# Patient Record
Sex: Female | Born: 1965 | Race: Black or African American | Hispanic: No | Marital: Married | Smoking: Never smoker
Health system: Southern US, Community
[De-identification: ages and names within clinical notes are randomized; demographics above are authoritative.]

## PROBLEM LIST (undated history)

## (undated) DIAGNOSIS — M1990 Unspecified osteoarthritis, unspecified site: Secondary | ICD-10-CM

## (undated) DIAGNOSIS — I10 Essential (primary) hypertension: Secondary | ICD-10-CM

## (undated) DIAGNOSIS — K219 Gastro-esophageal reflux disease without esophagitis: Secondary | ICD-10-CM

## (undated) DIAGNOSIS — M797 Fibromyalgia: Secondary | ICD-10-CM

## (undated) DIAGNOSIS — F32A Depression, unspecified: Secondary | ICD-10-CM

## (undated) DIAGNOSIS — K589 Irritable bowel syndrome without diarrhea: Secondary | ICD-10-CM

## (undated) DIAGNOSIS — F329 Major depressive disorder, single episode, unspecified: Secondary | ICD-10-CM

## (undated) DIAGNOSIS — B029 Zoster without complications: Secondary | ICD-10-CM

## (undated) HISTORY — DX: Unspecified osteoarthritis, unspecified site: M19.90

## (undated) HISTORY — PX: CHOLECYSTECTOMY: SHX55

## (undated) HISTORY — PX: TUBAL LIGATION: SHX77

---

## 1994-10-10 HISTORY — PX: CYSTECTOMY: SUR359

## 1998-01-15 ENCOUNTER — Emergency Department (HOSPITAL_COMMUNITY): Admission: EM | Admit: 1998-01-15 | Discharge: 1998-01-15 | Payer: Self-pay | Admitting: Emergency Medicine

## 1998-05-26 ENCOUNTER — Emergency Department (HOSPITAL_COMMUNITY): Admission: EM | Admit: 1998-05-26 | Discharge: 1998-05-26 | Payer: Self-pay | Admitting: Internal Medicine

## 1998-09-25 ENCOUNTER — Ambulatory Visit (HOSPITAL_COMMUNITY): Admission: RE | Admit: 1998-09-25 | Discharge: 1998-09-25 | Payer: Self-pay | Admitting: Family Medicine

## 1998-09-25 ENCOUNTER — Encounter: Payer: Self-pay | Admitting: Family Medicine

## 1998-10-10 ENCOUNTER — Encounter: Payer: Self-pay | Admitting: Emergency Medicine

## 1998-10-10 ENCOUNTER — Emergency Department (HOSPITAL_COMMUNITY): Admission: EM | Admit: 1998-10-10 | Discharge: 1998-10-10 | Payer: Self-pay | Admitting: Emergency Medicine

## 1998-11-11 ENCOUNTER — Ambulatory Visit (HOSPITAL_COMMUNITY): Admission: RE | Admit: 1998-11-11 | Discharge: 1998-11-11 | Payer: Self-pay | Admitting: Chiropractic Medicine

## 1998-11-11 ENCOUNTER — Encounter: Payer: Self-pay | Admitting: Chiropractic Medicine

## 1999-02-01 ENCOUNTER — Encounter: Admission: RE | Admit: 1999-02-01 | Discharge: 1999-02-17 | Payer: Self-pay | Admitting: Neurosurgery

## 1999-03-01 ENCOUNTER — Ambulatory Visit (HOSPITAL_COMMUNITY): Admission: RE | Admit: 1999-03-01 | Discharge: 1999-03-01 | Payer: Self-pay | Admitting: Orthopedic Surgery

## 1999-03-01 ENCOUNTER — Encounter: Payer: Self-pay | Admitting: Orthopedic Surgery

## 1999-03-19 ENCOUNTER — Encounter: Admission: RE | Admit: 1999-03-19 | Discharge: 1999-05-05 | Payer: Self-pay | Admitting: Orthopedic Surgery

## 1999-06-30 ENCOUNTER — Emergency Department (HOSPITAL_COMMUNITY): Admission: EM | Admit: 1999-06-30 | Discharge: 1999-06-30 | Payer: Self-pay | Admitting: Emergency Medicine

## 2000-03-03 ENCOUNTER — Emergency Department (HOSPITAL_COMMUNITY): Admission: EM | Admit: 2000-03-03 | Discharge: 2000-03-03 | Payer: Self-pay | Admitting: Emergency Medicine

## 2000-06-03 ENCOUNTER — Emergency Department (HOSPITAL_COMMUNITY): Admission: EM | Admit: 2000-06-03 | Discharge: 2000-06-03 | Payer: Self-pay | Admitting: Emergency Medicine

## 2000-06-05 ENCOUNTER — Inpatient Hospital Stay (HOSPITAL_COMMUNITY): Admission: EM | Admit: 2000-06-05 | Discharge: 2000-06-07 | Payer: Self-pay | Admitting: Emergency Medicine

## 2000-06-05 ENCOUNTER — Encounter: Payer: Self-pay | Admitting: Emergency Medicine

## 2000-06-05 ENCOUNTER — Encounter (INDEPENDENT_AMBULATORY_CARE_PROVIDER_SITE_OTHER): Payer: Self-pay

## 2000-06-05 ENCOUNTER — Ambulatory Visit (HOSPITAL_COMMUNITY): Admission: RE | Admit: 2000-06-05 | Discharge: 2000-06-05 | Payer: Self-pay | Admitting: Emergency Medicine

## 2001-01-11 ENCOUNTER — Encounter: Admission: RE | Admit: 2001-01-11 | Discharge: 2001-01-11 | Payer: Self-pay | Admitting: Hematology and Oncology

## 2001-01-18 ENCOUNTER — Encounter: Admission: RE | Admit: 2001-01-18 | Discharge: 2001-01-18 | Payer: Self-pay | Admitting: Hematology and Oncology

## 2001-01-29 ENCOUNTER — Emergency Department (HOSPITAL_COMMUNITY): Admission: EM | Admit: 2001-01-29 | Discharge: 2001-01-29 | Payer: Self-pay | Admitting: Emergency Medicine

## 2001-02-18 ENCOUNTER — Emergency Department (HOSPITAL_COMMUNITY): Admission: EM | Admit: 2001-02-18 | Discharge: 2001-02-18 | Payer: Self-pay | Admitting: Emergency Medicine

## 2001-03-01 ENCOUNTER — Encounter: Payer: Self-pay | Admitting: Emergency Medicine

## 2001-03-01 ENCOUNTER — Emergency Department (HOSPITAL_COMMUNITY): Admission: EM | Admit: 2001-03-01 | Discharge: 2001-03-01 | Payer: Self-pay | Admitting: Internal Medicine

## 2001-03-29 ENCOUNTER — Encounter: Admission: RE | Admit: 2001-03-29 | Discharge: 2001-03-29 | Payer: Self-pay | Admitting: Internal Medicine

## 2001-08-20 ENCOUNTER — Encounter: Admission: RE | Admit: 2001-08-20 | Discharge: 2001-08-20 | Payer: Self-pay | Admitting: Internal Medicine

## 2001-12-26 ENCOUNTER — Encounter: Admission: RE | Admit: 2001-12-26 | Discharge: 2001-12-26 | Payer: Self-pay | Admitting: Internal Medicine

## 2002-01-11 ENCOUNTER — Encounter: Admission: RE | Admit: 2002-01-11 | Discharge: 2002-01-11 | Payer: Self-pay | Admitting: Internal Medicine

## 2002-01-28 ENCOUNTER — Encounter: Payer: Self-pay | Admitting: Emergency Medicine

## 2002-01-28 ENCOUNTER — Emergency Department (HOSPITAL_COMMUNITY): Admission: EM | Admit: 2002-01-28 | Discharge: 2002-01-28 | Payer: Self-pay | Admitting: Emergency Medicine

## 2002-02-04 ENCOUNTER — Encounter: Admission: RE | Admit: 2002-02-04 | Discharge: 2002-02-04 | Payer: Self-pay | Admitting: *Deleted

## 2002-07-12 ENCOUNTER — Encounter: Payer: Self-pay | Admitting: Emergency Medicine

## 2002-07-12 ENCOUNTER — Emergency Department (HOSPITAL_COMMUNITY): Admission: EM | Admit: 2002-07-12 | Discharge: 2002-07-12 | Payer: Self-pay | Admitting: Emergency Medicine

## 2002-08-28 ENCOUNTER — Encounter: Admission: RE | Admit: 2002-08-28 | Discharge: 2002-08-28 | Payer: Self-pay | Admitting: Internal Medicine

## 2002-08-28 ENCOUNTER — Ambulatory Visit (HOSPITAL_COMMUNITY): Admission: RE | Admit: 2002-08-28 | Discharge: 2002-08-28 | Payer: Self-pay | Admitting: Internal Medicine

## 2002-08-28 ENCOUNTER — Encounter: Payer: Self-pay | Admitting: Internal Medicine

## 2002-09-25 ENCOUNTER — Encounter: Admission: RE | Admit: 2002-09-25 | Discharge: 2002-09-25 | Payer: Self-pay | Admitting: Internal Medicine

## 2002-09-30 ENCOUNTER — Emergency Department (HOSPITAL_COMMUNITY): Admission: EM | Admit: 2002-09-30 | Discharge: 2002-10-01 | Payer: Self-pay | Admitting: Emergency Medicine

## 2002-10-01 ENCOUNTER — Encounter: Payer: Self-pay | Admitting: Emergency Medicine

## 2002-10-17 ENCOUNTER — Encounter: Admission: RE | Admit: 2002-10-17 | Discharge: 2002-10-17 | Payer: Self-pay | Admitting: Internal Medicine

## 2003-08-27 ENCOUNTER — Encounter: Admission: RE | Admit: 2003-08-27 | Discharge: 2003-08-27 | Payer: Self-pay | Admitting: Internal Medicine

## 2003-09-11 ENCOUNTER — Emergency Department (HOSPITAL_COMMUNITY): Admission: EM | Admit: 2003-09-11 | Discharge: 2003-09-11 | Payer: Self-pay | Admitting: Emergency Medicine

## 2003-09-23 ENCOUNTER — Encounter: Admission: RE | Admit: 2003-09-23 | Discharge: 2003-09-23 | Payer: Self-pay | Admitting: Internal Medicine

## 2003-11-12 ENCOUNTER — Encounter: Admission: RE | Admit: 2003-11-12 | Discharge: 2003-11-12 | Payer: Self-pay | Admitting: Internal Medicine

## 2003-11-14 ENCOUNTER — Ambulatory Visit (HOSPITAL_COMMUNITY): Admission: RE | Admit: 2003-11-14 | Discharge: 2003-11-14 | Payer: Self-pay | Admitting: Internal Medicine

## 2004-01-14 ENCOUNTER — Emergency Department (HOSPITAL_COMMUNITY): Admission: EM | Admit: 2004-01-14 | Discharge: 2004-01-14 | Payer: Self-pay | Admitting: Emergency Medicine

## 2004-01-19 ENCOUNTER — Encounter: Admission: RE | Admit: 2004-01-19 | Discharge: 2004-01-19 | Payer: Self-pay | Admitting: Internal Medicine

## 2004-01-27 ENCOUNTER — Encounter: Admission: RE | Admit: 2004-01-27 | Discharge: 2004-01-27 | Payer: Self-pay | Admitting: Internal Medicine

## 2004-02-13 ENCOUNTER — Emergency Department (HOSPITAL_COMMUNITY): Admission: EM | Admit: 2004-02-13 | Discharge: 2004-02-13 | Payer: Self-pay | Admitting: Emergency Medicine

## 2004-02-17 ENCOUNTER — Encounter: Admission: RE | Admit: 2004-02-17 | Discharge: 2004-02-17 | Payer: Self-pay | Admitting: Internal Medicine

## 2004-04-09 ENCOUNTER — Emergency Department (HOSPITAL_COMMUNITY): Admission: EM | Admit: 2004-04-09 | Discharge: 2004-04-09 | Payer: Self-pay | Admitting: Emergency Medicine

## 2004-06-03 ENCOUNTER — Encounter: Admission: RE | Admit: 2004-06-03 | Discharge: 2004-06-03 | Payer: Self-pay | Admitting: Internal Medicine

## 2004-06-18 ENCOUNTER — Emergency Department (HOSPITAL_COMMUNITY): Admission: EM | Admit: 2004-06-18 | Discharge: 2004-06-18 | Payer: Self-pay | Admitting: Emergency Medicine

## 2004-06-25 ENCOUNTER — Ambulatory Visit: Payer: Self-pay | Admitting: Internal Medicine

## 2004-07-15 ENCOUNTER — Ambulatory Visit: Payer: Self-pay | Admitting: Internal Medicine

## 2004-08-28 ENCOUNTER — Emergency Department (HOSPITAL_COMMUNITY): Admission: EM | Admit: 2004-08-28 | Discharge: 2004-08-29 | Payer: Self-pay | Admitting: Emergency Medicine

## 2004-09-06 ENCOUNTER — Ambulatory Visit: Payer: Self-pay | Admitting: Internal Medicine

## 2004-09-15 ENCOUNTER — Ambulatory Visit (HOSPITAL_COMMUNITY): Admission: RE | Admit: 2004-09-15 | Discharge: 2004-09-15 | Payer: Self-pay | Admitting: *Deleted

## 2004-10-14 ENCOUNTER — Encounter (INDEPENDENT_AMBULATORY_CARE_PROVIDER_SITE_OTHER): Payer: Self-pay | Admitting: *Deleted

## 2004-10-14 ENCOUNTER — Ambulatory Visit: Payer: Self-pay | Admitting: Internal Medicine

## 2004-12-26 ENCOUNTER — Encounter: Admission: RE | Admit: 2004-12-26 | Discharge: 2004-12-26 | Payer: Self-pay | Admitting: Rheumatology

## 2005-01-10 ENCOUNTER — Ambulatory Visit: Payer: Self-pay | Admitting: Internal Medicine

## 2005-02-06 ENCOUNTER — Emergency Department (HOSPITAL_COMMUNITY): Admission: EM | Admit: 2005-02-06 | Discharge: 2005-02-06 | Payer: Self-pay | Admitting: *Deleted

## 2005-03-09 ENCOUNTER — Ambulatory Visit: Payer: Self-pay | Admitting: Internal Medicine

## 2005-04-27 ENCOUNTER — Encounter: Admission: RE | Admit: 2005-04-27 | Discharge: 2005-04-27 | Payer: Self-pay | Admitting: Rheumatology

## 2005-08-02 ENCOUNTER — Emergency Department (HOSPITAL_COMMUNITY): Admission: EM | Admit: 2005-08-02 | Discharge: 2005-08-02 | Payer: Self-pay | Admitting: Emergency Medicine

## 2005-08-12 ENCOUNTER — Ambulatory Visit: Payer: Self-pay | Admitting: Internal Medicine

## 2005-11-09 ENCOUNTER — Ambulatory Visit (HOSPITAL_COMMUNITY): Admission: RE | Admit: 2005-11-09 | Discharge: 2005-11-09 | Payer: Self-pay | Admitting: Gastroenterology

## 2006-02-14 ENCOUNTER — Ambulatory Visit: Payer: Self-pay | Admitting: Internal Medicine

## 2006-03-06 ENCOUNTER — Emergency Department: Payer: Self-pay | Admitting: Emergency Medicine

## 2006-05-04 ENCOUNTER — Ambulatory Visit: Payer: Self-pay | Admitting: Internal Medicine

## 2006-09-04 IMAGING — US US TRANSVAGINAL NON-OB
1 series · 14 of 25 positions shown · non-contrast
Comparison: none

CLINICAL DATA: Right ovarian cyst on recent CT scan from 08/28/04.  
 TRANSABDOMINAL AND ENDOVAGINAL PELVIC ULTRASOUND:
 Transabdominal and endovaginal scanning was performed.  
 The uterus measures 9.7 x 5.5 x 6.3 cm.  Endometrial stripe is 7 ? 8 mm in thickness.  Prominent vascular anatomy noted in the peripheral myometrium, although myometrial echotexture is homogeneous.  
 The right ovary is 3.8 x 2.0 x 2.1 cm.  The left ovary measures 2.9 x 2.2 x 2.7 cm.  Small amount of fluid is identified in the peritoneal cavity.

[Series 1: us transvaginal non-ob · 0.33mm/px · 14 of 74 slices shown]
[im 1/74]
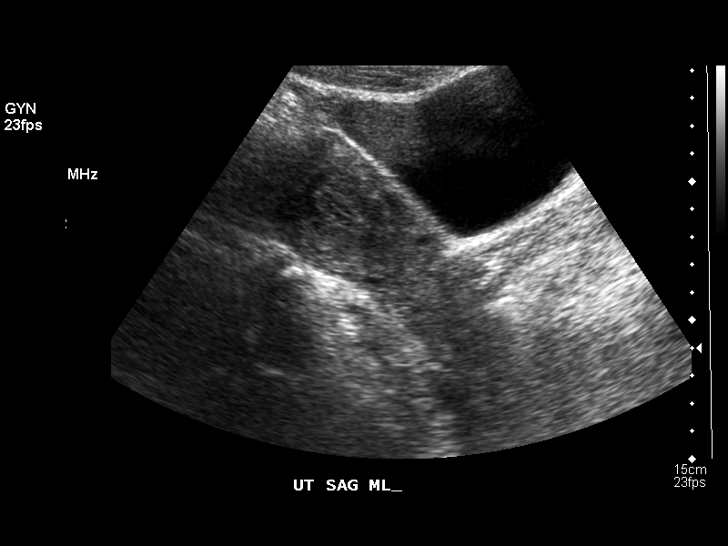
[im 7/74]
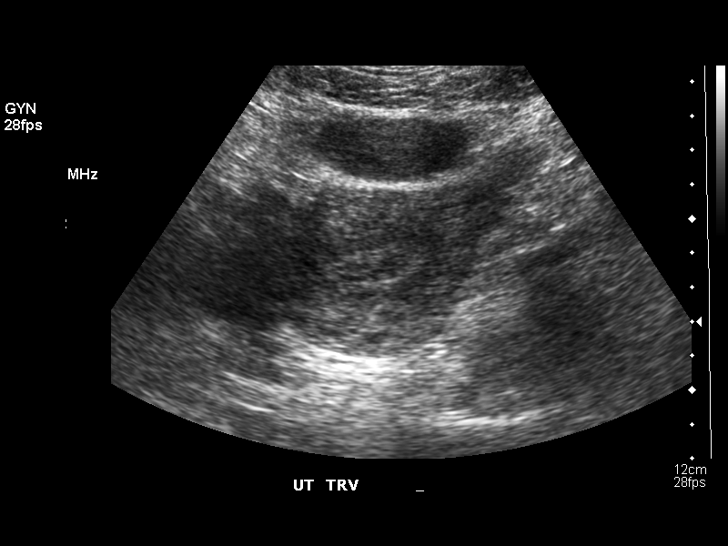
[im 13/74]
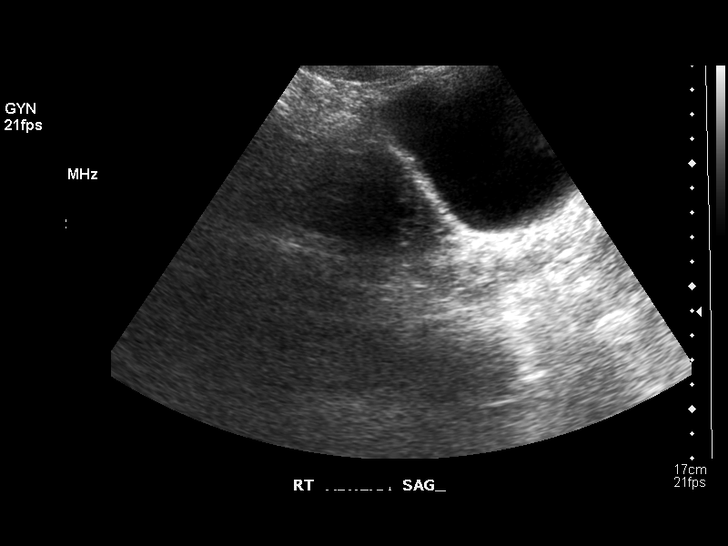
[im 19/74]
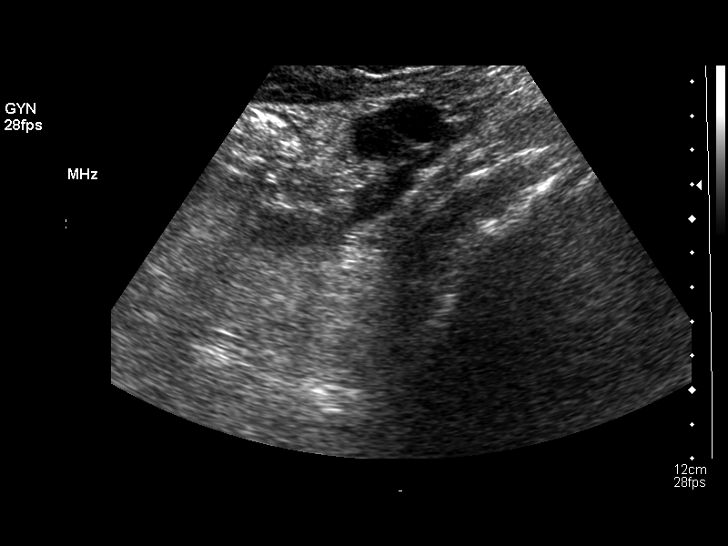
[im 25/74]
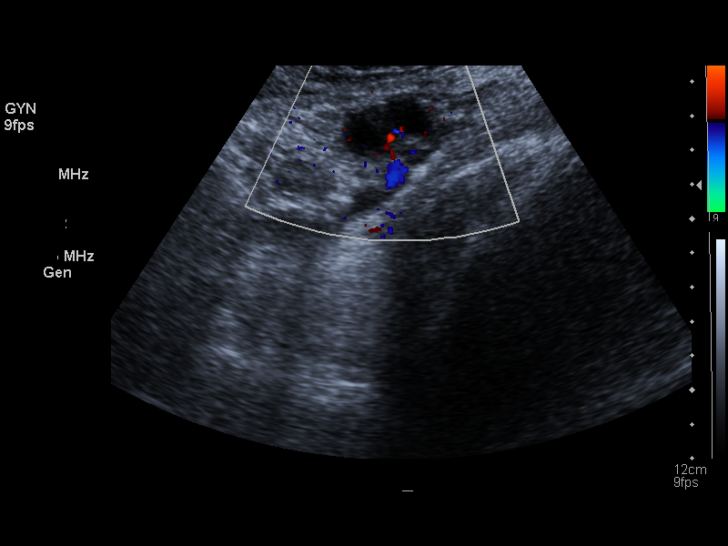
[im 28/74]
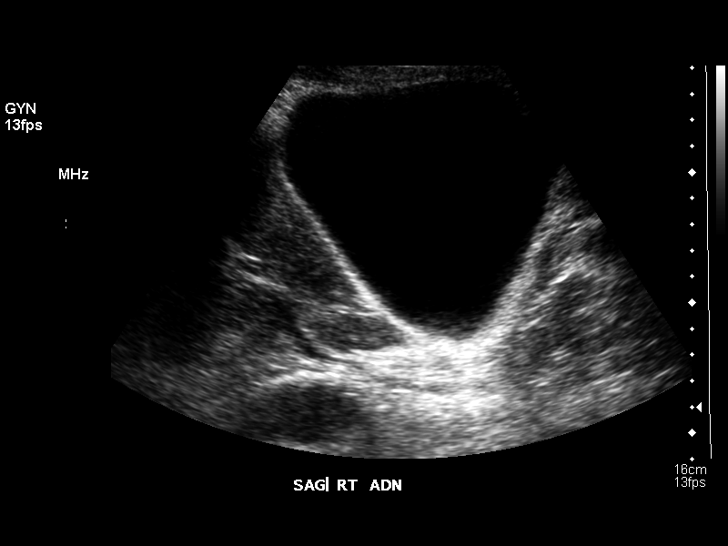
[im 34/74]
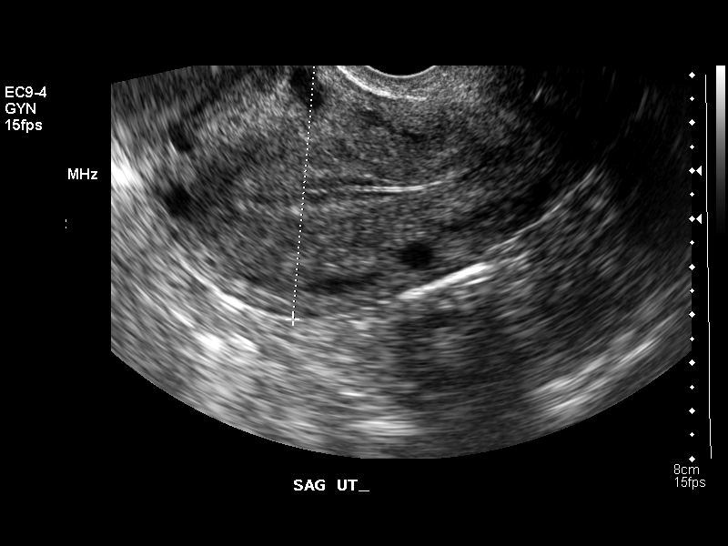
[im 40/74]
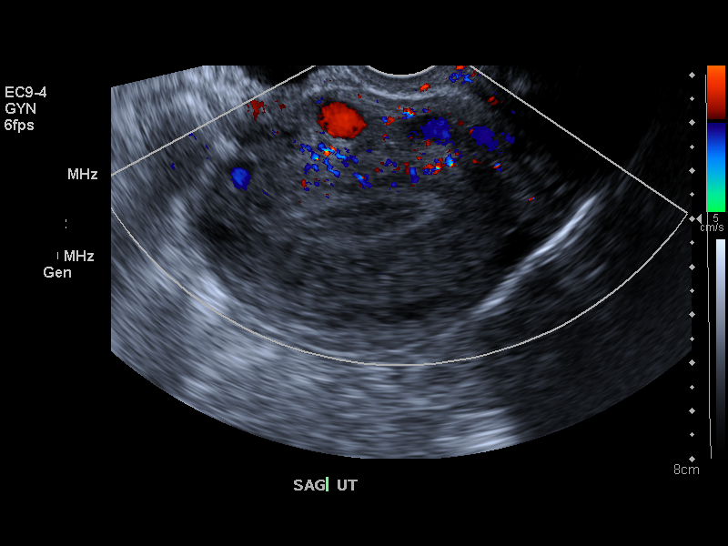
[im 46/74]
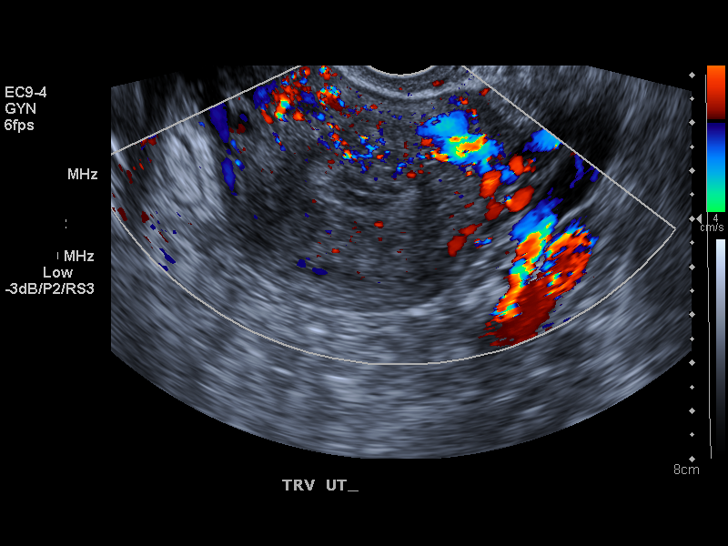
[im 49/74]
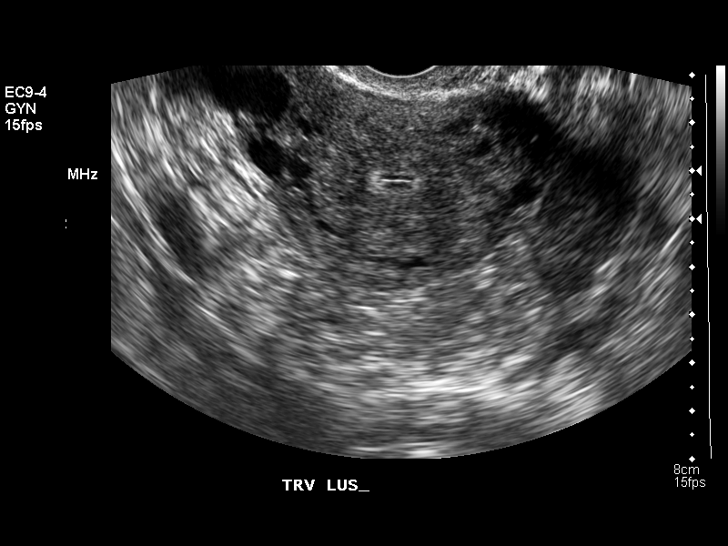
[im 55/74]
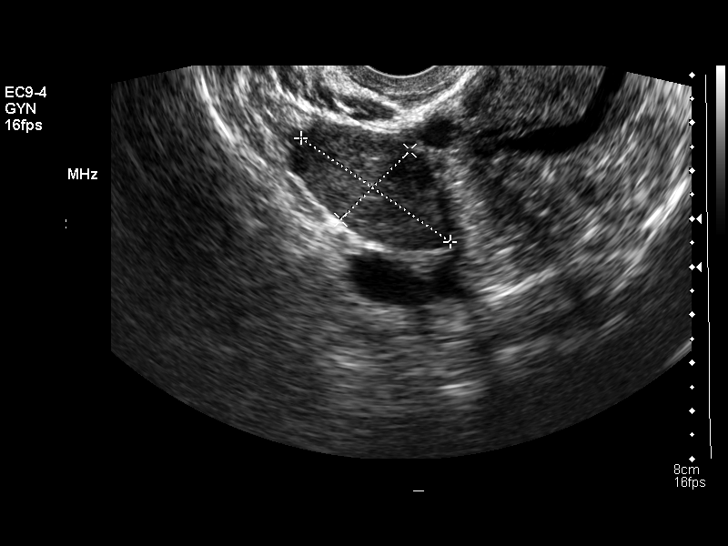
[im 61/74]
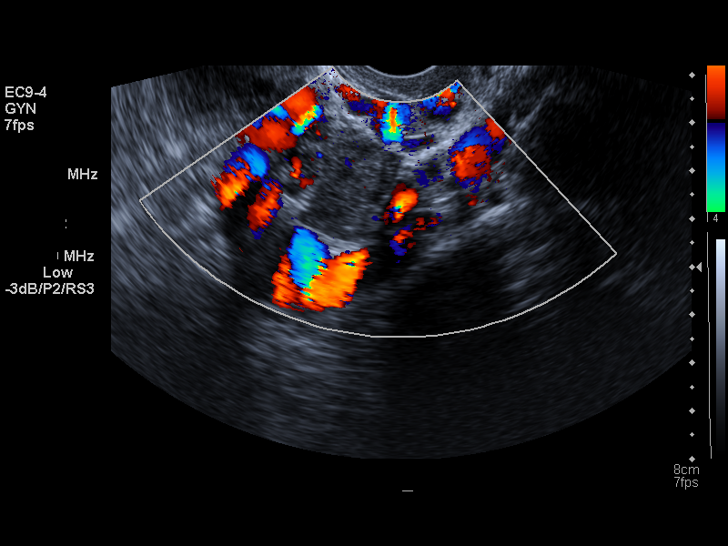
[im 67/74]
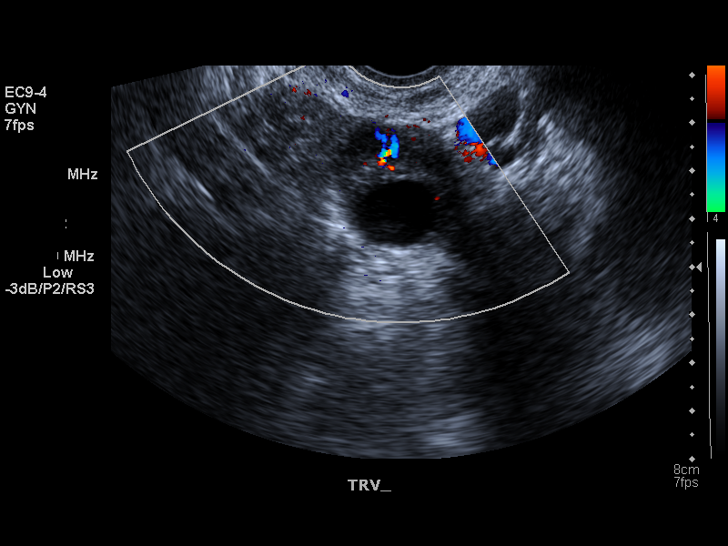
[im 74/74]
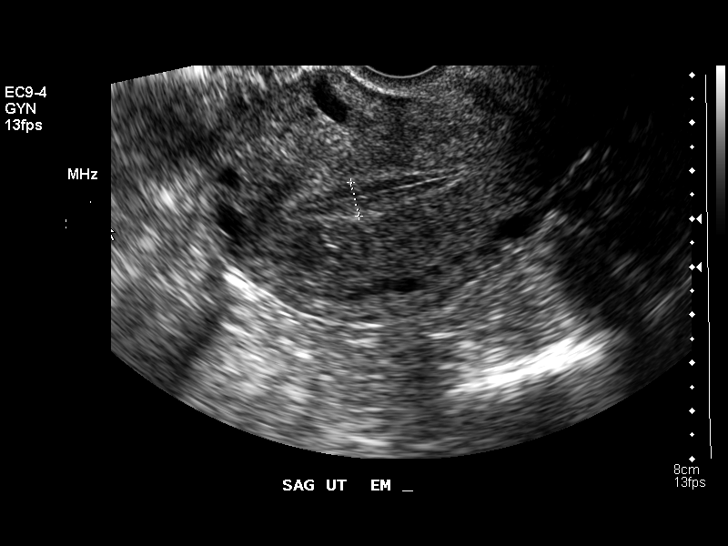

[14 of 25 positions shown; findings below may reference images not displayed]

IMPRESSION: 1.  No evidence for ovarian cyst.  The ovaries have normal sonographic appearance bilaterally.
 2.  Trace free fluid in the peritoneal cavity.

## 2006-09-18 ENCOUNTER — Ambulatory Visit: Payer: Self-pay | Admitting: Internal Medicine

## 2006-09-25 ENCOUNTER — Ambulatory Visit: Payer: Self-pay | Admitting: Gastroenterology

## 2006-10-20 ENCOUNTER — Ambulatory Visit: Payer: Self-pay | Admitting: Gastroenterology

## 2006-10-20 LAB — CONVERTED CEMR LAB
BUN: 9 mg/dL (ref 6–23)
CO2: 32 meq/L (ref 19–32)
Calcium: 9.6 mg/dL (ref 8.4–10.5)
Creatinine, Ser: 0.8 mg/dL (ref 0.4–1.2)
Glomerular Filtration Rate, Af Am: 101 mL/min/{1.73_m2}
Glucose, Bld: 117 mg/dL — ABNORMAL HIGH (ref 70–99)
Potassium: 3.8 meq/L (ref 3.5–5.1)

## 2006-10-27 ENCOUNTER — Emergency Department (HOSPITAL_COMMUNITY): Admission: EM | Admit: 2006-10-27 | Discharge: 2006-10-27 | Payer: Self-pay | Admitting: Emergency Medicine

## 2006-10-29 ENCOUNTER — Emergency Department (HOSPITAL_COMMUNITY): Admission: EM | Admit: 2006-10-29 | Discharge: 2006-10-29 | Payer: Self-pay | Admitting: Family Medicine

## 2006-11-10 ENCOUNTER — Emergency Department (HOSPITAL_COMMUNITY): Admission: EM | Admit: 2006-11-10 | Discharge: 2006-11-10 | Payer: Self-pay | Admitting: Emergency Medicine

## 2006-11-20 ENCOUNTER — Emergency Department (HOSPITAL_COMMUNITY): Admission: EM | Admit: 2006-11-20 | Discharge: 2006-11-20 | Payer: Self-pay | Admitting: Emergency Medicine

## 2006-12-01 ENCOUNTER — Ambulatory Visit: Payer: Self-pay | Admitting: Internal Medicine

## 2006-12-01 DIAGNOSIS — G43909 Migraine, unspecified, not intractable, without status migrainosus: Secondary | ICD-10-CM | POA: Insufficient documentation

## 2006-12-01 DIAGNOSIS — K219 Gastro-esophageal reflux disease without esophagitis: Secondary | ICD-10-CM | POA: Insufficient documentation

## 2006-12-01 DIAGNOSIS — K2990 Gastroduodenitis, unspecified, without bleeding: Secondary | ICD-10-CM

## 2006-12-01 DIAGNOSIS — F329 Major depressive disorder, single episode, unspecified: Secondary | ICD-10-CM

## 2006-12-01 DIAGNOSIS — B957 Other staphylococcus as the cause of diseases classified elsewhere: Secondary | ICD-10-CM

## 2006-12-01 DIAGNOSIS — K589 Irritable bowel syndrome without diarrhea: Secondary | ICD-10-CM

## 2006-12-01 DIAGNOSIS — K2970 Gastritis, unspecified, without bleeding: Secondary | ICD-10-CM | POA: Insufficient documentation

## 2006-12-01 DIAGNOSIS — F411 Generalized anxiety disorder: Secondary | ICD-10-CM | POA: Insufficient documentation

## 2006-12-01 DIAGNOSIS — J301 Allergic rhinitis due to pollen: Secondary | ICD-10-CM

## 2007-01-02 ENCOUNTER — Telehealth: Payer: Self-pay | Admitting: *Deleted

## 2007-02-07 ENCOUNTER — Telehealth (INDEPENDENT_AMBULATORY_CARE_PROVIDER_SITE_OTHER): Payer: Self-pay | Admitting: *Deleted

## 2007-03-19 ENCOUNTER — Ambulatory Visit: Payer: Self-pay | Admitting: Internal Medicine

## 2007-03-19 DIAGNOSIS — IMO0001 Reserved for inherently not codable concepts without codable children: Secondary | ICD-10-CM

## 2007-04-19 ENCOUNTER — Ambulatory Visit: Payer: Self-pay | Admitting: Internal Medicine

## 2007-04-19 ENCOUNTER — Telehealth: Payer: Self-pay | Admitting: *Deleted

## 2007-04-20 ENCOUNTER — Telehealth: Payer: Self-pay | Admitting: *Deleted

## 2007-06-01 ENCOUNTER — Telehealth: Payer: Self-pay | Admitting: *Deleted

## 2007-06-18 ENCOUNTER — Ambulatory Visit: Payer: Self-pay | Admitting: Internal Medicine

## 2007-06-18 ENCOUNTER — Encounter (INDEPENDENT_AMBULATORY_CARE_PROVIDER_SITE_OTHER): Payer: Self-pay | Admitting: *Deleted

## 2007-06-18 DIAGNOSIS — N912 Amenorrhea, unspecified: Secondary | ICD-10-CM | POA: Insufficient documentation

## 2007-06-19 ENCOUNTER — Ambulatory Visit (HOSPITAL_COMMUNITY): Admission: RE | Admit: 2007-06-19 | Discharge: 2007-06-19 | Payer: Self-pay | Admitting: *Deleted

## 2007-07-05 ENCOUNTER — Encounter (INDEPENDENT_AMBULATORY_CARE_PROVIDER_SITE_OTHER): Payer: Self-pay | Admitting: *Deleted

## 2007-07-05 ENCOUNTER — Ambulatory Visit: Payer: Self-pay | Admitting: Internal Medicine

## 2007-07-05 DIAGNOSIS — M25519 Pain in unspecified shoulder: Secondary | ICD-10-CM | POA: Insufficient documentation

## 2007-07-05 DIAGNOSIS — N951 Menopausal and female climacteric states: Secondary | ICD-10-CM

## 2007-07-12 ENCOUNTER — Ambulatory Visit: Payer: Self-pay | Admitting: Gastroenterology

## 2007-07-25 ENCOUNTER — Ambulatory Visit: Payer: Self-pay | Admitting: Gastroenterology

## 2007-07-30 ENCOUNTER — Ambulatory Visit (HOSPITAL_COMMUNITY): Admission: RE | Admit: 2007-07-30 | Discharge: 2007-07-30 | Payer: Self-pay | Admitting: Gastroenterology

## 2007-08-21 ENCOUNTER — Emergency Department (HOSPITAL_COMMUNITY): Admission: EM | Admit: 2007-08-21 | Discharge: 2007-08-21 | Payer: Self-pay | Admitting: Emergency Medicine

## 2007-08-21 ENCOUNTER — Telehealth: Payer: Self-pay | Admitting: *Deleted

## 2007-09-23 ENCOUNTER — Emergency Department (HOSPITAL_COMMUNITY): Admission: EM | Admit: 2007-09-23 | Discharge: 2007-09-23 | Payer: Self-pay | Admitting: Emergency Medicine

## 2007-10-10 ENCOUNTER — Emergency Department (HOSPITAL_COMMUNITY): Admission: EM | Admit: 2007-10-10 | Discharge: 2007-10-10 | Payer: Self-pay | Admitting: Family Medicine

## 2007-10-19 ENCOUNTER — Encounter (INDEPENDENT_AMBULATORY_CARE_PROVIDER_SITE_OTHER): Payer: Self-pay | Admitting: Internal Medicine

## 2007-10-29 ENCOUNTER — Telehealth: Payer: Self-pay | Admitting: *Deleted

## 2007-10-29 ENCOUNTER — Ambulatory Visit: Payer: Self-pay | Admitting: Internal Medicine

## 2007-11-16 ENCOUNTER — Encounter (INDEPENDENT_AMBULATORY_CARE_PROVIDER_SITE_OTHER): Payer: Self-pay | Admitting: *Deleted

## 2007-11-16 ENCOUNTER — Ambulatory Visit: Payer: Self-pay | Admitting: Hospitalist

## 2007-11-16 DIAGNOSIS — I10 Essential (primary) hypertension: Secondary | ICD-10-CM | POA: Insufficient documentation

## 2007-11-16 LAB — CONVERTED CEMR LAB
Alkaline Phosphatase: 68 units/L (ref 39–117)
BUN: 11 mg/dL (ref 6–23)
CO2: 28 meq/L (ref 19–32)
Creatinine, Ser: 0.87 mg/dL (ref 0.40–1.20)
Glucose, Bld: 91 mg/dL (ref 70–99)
Total Bilirubin: 0.3 mg/dL (ref 0.3–1.2)
Total Protein: 7.9 g/dL (ref 6.0–8.3)

## 2007-12-21 ENCOUNTER — Emergency Department (HOSPITAL_COMMUNITY): Admission: EM | Admit: 2007-12-21 | Discharge: 2007-12-21 | Payer: Self-pay | Admitting: Family Medicine

## 2008-01-07 ENCOUNTER — Telehealth: Payer: Self-pay | Admitting: *Deleted

## 2008-02-22 ENCOUNTER — Telehealth: Payer: Self-pay | Admitting: Infectious Disease

## 2008-02-25 ENCOUNTER — Telehealth (INDEPENDENT_AMBULATORY_CARE_PROVIDER_SITE_OTHER): Payer: Self-pay | Admitting: *Deleted

## 2008-03-10 ENCOUNTER — Ambulatory Visit: Payer: Self-pay | Admitting: Internal Medicine

## 2008-03-10 ENCOUNTER — Encounter (INDEPENDENT_AMBULATORY_CARE_PROVIDER_SITE_OTHER): Payer: Self-pay | Admitting: *Deleted

## 2008-03-10 DIAGNOSIS — J330 Polyp of nasal cavity: Secondary | ICD-10-CM | POA: Insufficient documentation

## 2008-03-10 DIAGNOSIS — H5200 Hypermetropia, unspecified eye: Secondary | ICD-10-CM

## 2008-03-10 LAB — CONVERTED CEMR LAB
CO2: 27 meq/L (ref 19–32)
Calcium: 9.4 mg/dL (ref 8.4–10.5)
Glucose, Bld: 90 mg/dL (ref 70–99)
Potassium: 4.3 meq/L (ref 3.5–5.3)
Sodium: 140 meq/L (ref 135–145)

## 2008-03-17 ENCOUNTER — Encounter (INDEPENDENT_AMBULATORY_CARE_PROVIDER_SITE_OTHER): Payer: Self-pay | Admitting: *Deleted

## 2008-04-11 ENCOUNTER — Emergency Department (HOSPITAL_COMMUNITY): Admission: EM | Admit: 2008-04-11 | Discharge: 2008-04-11 | Payer: Self-pay | Admitting: Emergency Medicine

## 2008-05-05 ENCOUNTER — Emergency Department (HOSPITAL_COMMUNITY): Admission: EM | Admit: 2008-05-05 | Discharge: 2008-05-05 | Payer: Self-pay | Admitting: Emergency Medicine

## 2008-05-06 ENCOUNTER — Inpatient Hospital Stay (HOSPITAL_COMMUNITY): Admission: AD | Admit: 2008-05-06 | Discharge: 2008-05-09 | Payer: Self-pay | Admitting: Internal Medicine

## 2008-05-06 ENCOUNTER — Ambulatory Visit: Payer: Self-pay | Admitting: Internal Medicine

## 2008-05-06 DIAGNOSIS — L0292 Furuncle, unspecified: Secondary | ICD-10-CM | POA: Insufficient documentation

## 2008-05-06 DIAGNOSIS — L0293 Carbuncle, unspecified: Secondary | ICD-10-CM

## 2008-05-08 DIAGNOSIS — R945 Abnormal results of liver function studies: Secondary | ICD-10-CM

## 2008-05-12 ENCOUNTER — Ambulatory Visit: Payer: Self-pay | Admitting: Infectious Diseases

## 2008-05-12 ENCOUNTER — Encounter (INDEPENDENT_AMBULATORY_CARE_PROVIDER_SITE_OTHER): Payer: Self-pay | Admitting: *Deleted

## 2008-05-13 LAB — CONVERTED CEMR LAB
ALT: 74 units/L — ABNORMAL HIGH (ref 0–35)
Alkaline Phosphatase: 132 units/L — ABNORMAL HIGH (ref 39–117)
BUN: 8 mg/dL (ref 6–23)
Calcium: 9.4 mg/dL (ref 8.4–10.5)
Chloride: 99 meq/L (ref 96–112)
Creatinine, Ser: 0.93 mg/dL (ref 0.40–1.20)
Eosinophils Absolute: 0.5 10*3/uL (ref 0.0–0.7)
Eosinophils Relative: 10 % — ABNORMAL HIGH (ref 0–5)
Glucose, Bld: 63 mg/dL — ABNORMAL LOW (ref 70–99)
HCT: 38 % (ref 36.0–46.0)
Hemoglobin: 12.6 g/dL (ref 12.0–15.0)
Lymphocytes Relative: 13 % (ref 12–46)
Lymphs Abs: 0.6 10*3/uL — ABNORMAL LOW (ref 0.7–4.0)
MCV: 94.6 fL (ref 78.0–100.0)
Monocytes Absolute: 0.8 10*3/uL (ref 0.1–1.0)
Neutro Abs: 2.7 10*3/uL (ref 1.7–7.7)
Platelets: 213 10*3/uL (ref 150–400)
Sodium: 135 meq/L (ref 135–145)
Total Bilirubin: 0.5 mg/dL (ref 0.3–1.2)
WBC: 4.6 10*3/uL (ref 4.0–10.5)

## 2008-05-19 ENCOUNTER — Telehealth (INDEPENDENT_AMBULATORY_CARE_PROVIDER_SITE_OTHER): Payer: Self-pay | Admitting: *Deleted

## 2008-05-26 ENCOUNTER — Ambulatory Visit: Payer: Self-pay | Admitting: Infectious Diseases

## 2008-05-26 ENCOUNTER — Inpatient Hospital Stay (HOSPITAL_COMMUNITY): Admission: EM | Admit: 2008-05-26 | Discharge: 2008-05-28 | Payer: Self-pay | Admitting: Emergency Medicine

## 2008-05-30 ENCOUNTER — Telehealth: Payer: Self-pay | Admitting: Internal Medicine

## 2008-05-30 ENCOUNTER — Telehealth (INDEPENDENT_AMBULATORY_CARE_PROVIDER_SITE_OTHER): Payer: Self-pay | Admitting: Internal Medicine

## 2008-06-13 ENCOUNTER — Encounter: Payer: Self-pay | Admitting: Internal Medicine

## 2008-06-13 ENCOUNTER — Encounter (INDEPENDENT_AMBULATORY_CARE_PROVIDER_SITE_OTHER): Payer: Self-pay | Admitting: *Deleted

## 2008-06-13 ENCOUNTER — Ambulatory Visit: Payer: Self-pay | Admitting: Internal Medicine

## 2008-06-13 LAB — CONVERTED CEMR LAB
AST: 23 units/L (ref 0–37)
Albumin: 4.4 g/dL (ref 3.5–5.2)
Alkaline Phosphatase: 86 units/L (ref 39–117)
BUN: 6 mg/dL (ref 6–23)
Basophils Relative: 0 % (ref 0–1)
Calcium: 9.8 mg/dL (ref 8.4–10.5)
Chloride: 104 meq/L (ref 96–112)
Creatinine, Ser: 0.78 mg/dL (ref 0.40–1.20)
Eosinophils Absolute: 0.5 10*3/uL (ref 0.0–0.7)
Glucose, Bld: 87 mg/dL (ref 70–99)
HCT: 39.9 % (ref 36.0–46.0)
Hemoglobin: 12.7 g/dL (ref 12.0–15.0)
Lymphocytes Relative: 24 % (ref 12–46)
Lymphs Abs: 1.1 10*3/uL (ref 0.7–4.0)
MCHC: 31.8 g/dL (ref 30.0–36.0)
MCV: 95.5 fL (ref 78.0–100.0)
Monocytes Absolute: 0.5 10*3/uL (ref 0.1–1.0)
Monocytes Relative: 11 % (ref 3–12)
Neutro Abs: 2.5 10*3/uL (ref 1.7–7.7)
Platelets: 256 10*3/uL (ref 150–400)
RBC: 4.18 M/uL (ref 3.87–5.11)
WBC: 4.7 10*3/uL (ref 4.0–10.5)

## 2008-06-18 ENCOUNTER — Encounter (INDEPENDENT_AMBULATORY_CARE_PROVIDER_SITE_OTHER): Payer: Self-pay | Admitting: *Deleted

## 2008-07-05 ENCOUNTER — Encounter (INDEPENDENT_AMBULATORY_CARE_PROVIDER_SITE_OTHER): Payer: Self-pay | Admitting: *Deleted

## 2008-08-05 ENCOUNTER — Ambulatory Visit: Payer: Self-pay | Admitting: Internal Medicine

## 2008-09-22 ENCOUNTER — Ambulatory Visit: Payer: Self-pay | Admitting: Internal Medicine

## 2008-09-22 DIAGNOSIS — M25569 Pain in unspecified knee: Secondary | ICD-10-CM | POA: Insufficient documentation

## 2008-09-22 DIAGNOSIS — L299 Pruritus, unspecified: Secondary | ICD-10-CM | POA: Insufficient documentation

## 2008-09-22 DIAGNOSIS — M67919 Unspecified disorder of synovium and tendon, unspecified shoulder: Secondary | ICD-10-CM | POA: Insufficient documentation

## 2008-09-22 DIAGNOSIS — M719 Bursopathy, unspecified: Secondary | ICD-10-CM

## 2008-11-03 ENCOUNTER — Encounter (INDEPENDENT_AMBULATORY_CARE_PROVIDER_SITE_OTHER): Payer: Self-pay | Admitting: *Deleted

## 2008-11-03 ENCOUNTER — Ambulatory Visit: Payer: Self-pay | Admitting: Infectious Disease

## 2008-11-03 LAB — CONVERTED CEMR LAB
FSH: 7.4 milliintl units/mL
LH: 27.8 milliintl units/mL
Preg, Serum: NEGATIVE

## 2008-12-15 ENCOUNTER — Telehealth (INDEPENDENT_AMBULATORY_CARE_PROVIDER_SITE_OTHER): Payer: Self-pay | Admitting: *Deleted

## 2009-01-27 ENCOUNTER — Emergency Department (HOSPITAL_COMMUNITY): Admission: EM | Admit: 2009-01-27 | Discharge: 2009-01-27 | Payer: Self-pay | Admitting: Family Medicine

## 2009-04-10 ENCOUNTER — Telehealth (INDEPENDENT_AMBULATORY_CARE_PROVIDER_SITE_OTHER): Payer: Self-pay | Admitting: Internal Medicine

## 2009-04-16 ENCOUNTER — Emergency Department (HOSPITAL_COMMUNITY): Admission: EM | Admit: 2009-04-16 | Discharge: 2009-04-16 | Payer: Self-pay | Admitting: Emergency Medicine

## 2009-05-20 ENCOUNTER — Emergency Department (HOSPITAL_COMMUNITY): Admission: EM | Admit: 2009-05-20 | Discharge: 2009-05-20 | Payer: Self-pay | Admitting: Family Medicine

## 2009-05-30 ENCOUNTER — Emergency Department (HOSPITAL_COMMUNITY): Admission: EM | Admit: 2009-05-30 | Discharge: 2009-05-30 | Payer: Self-pay | Admitting: Emergency Medicine

## 2009-06-29 ENCOUNTER — Telehealth (INDEPENDENT_AMBULATORY_CARE_PROVIDER_SITE_OTHER): Payer: Self-pay | Admitting: Internal Medicine

## 2009-08-19 ENCOUNTER — Telehealth: Payer: Self-pay | Admitting: *Deleted

## 2009-09-08 ENCOUNTER — Telehealth: Payer: Self-pay | Admitting: *Deleted

## 2009-09-28 IMAGING — CR DG RIBS W/ CHEST 3+V*L*
3 series · 3 of 3 positions shown · non-contrast
Comparison: 09/23/07.

CLINICAL DATA: Pain in left upper posterior ribs.  
 UNILATERAL RIBS W/CHEST ? 3 VIEW:

[view not recorded (1 of 3)]
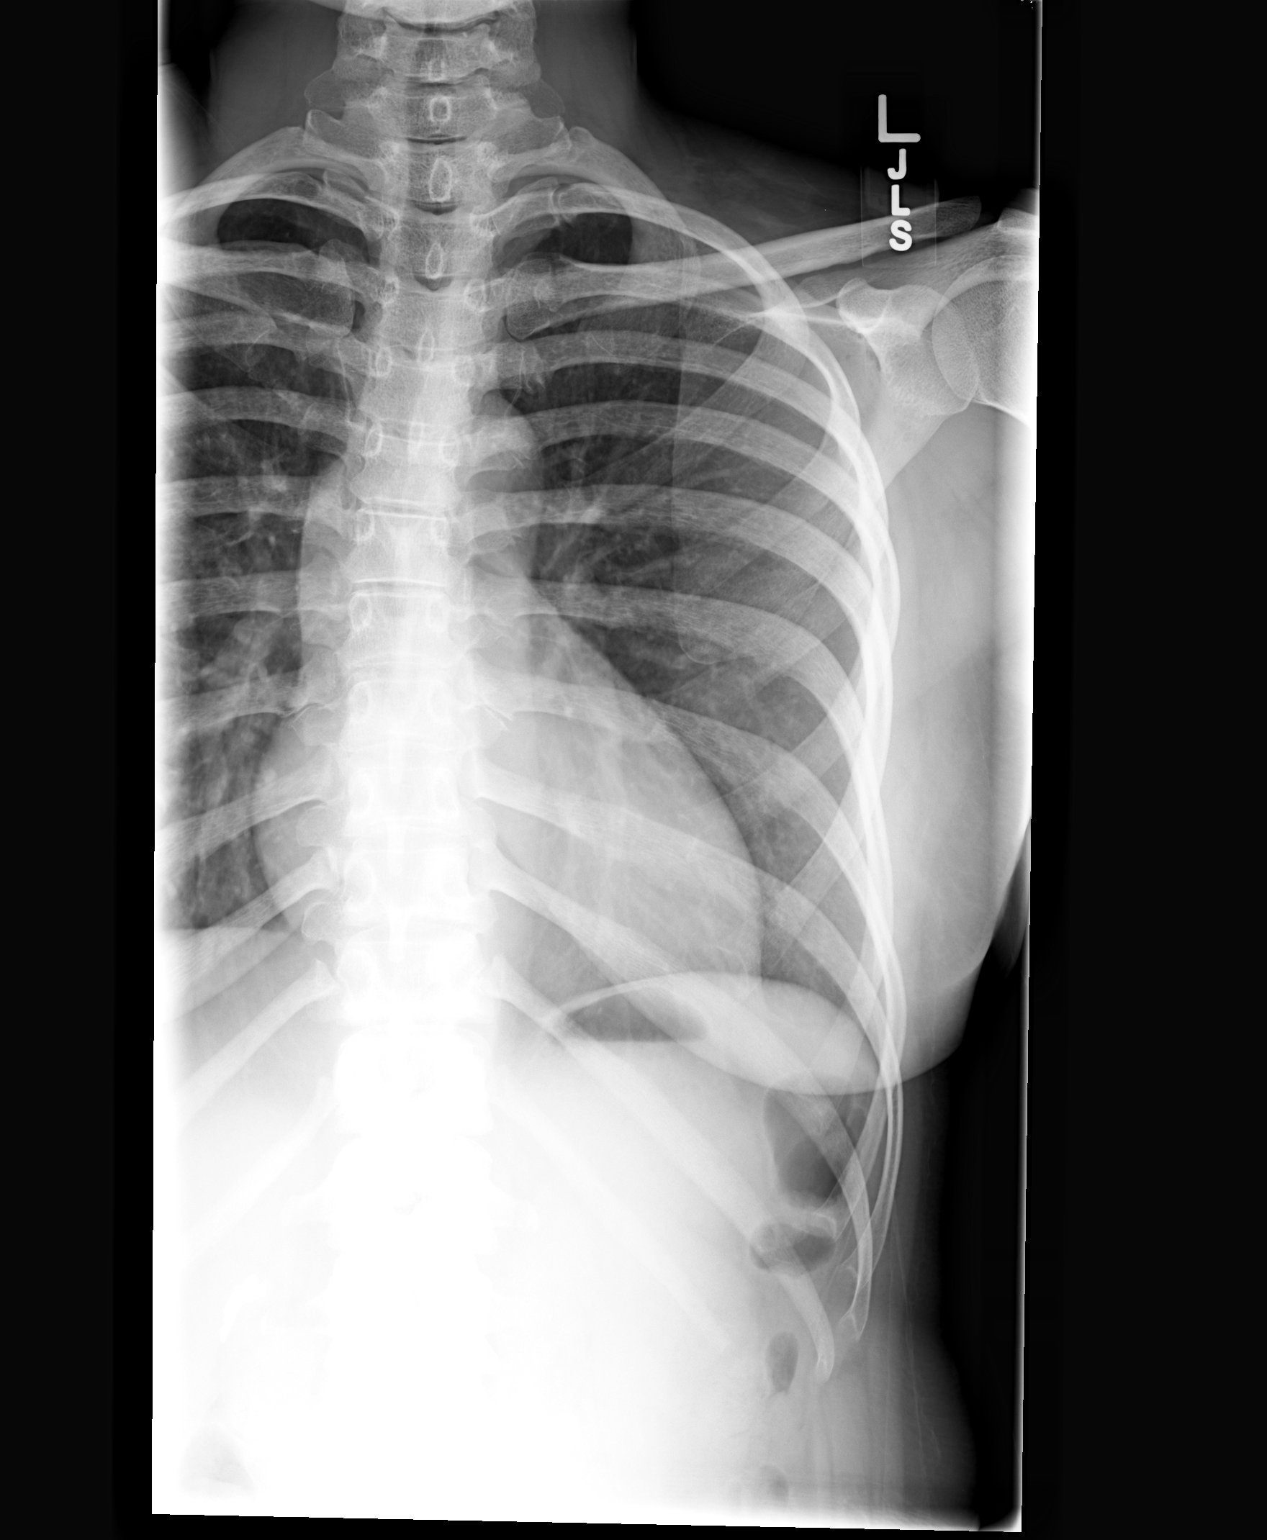

[view not recorded (2 of 3)]
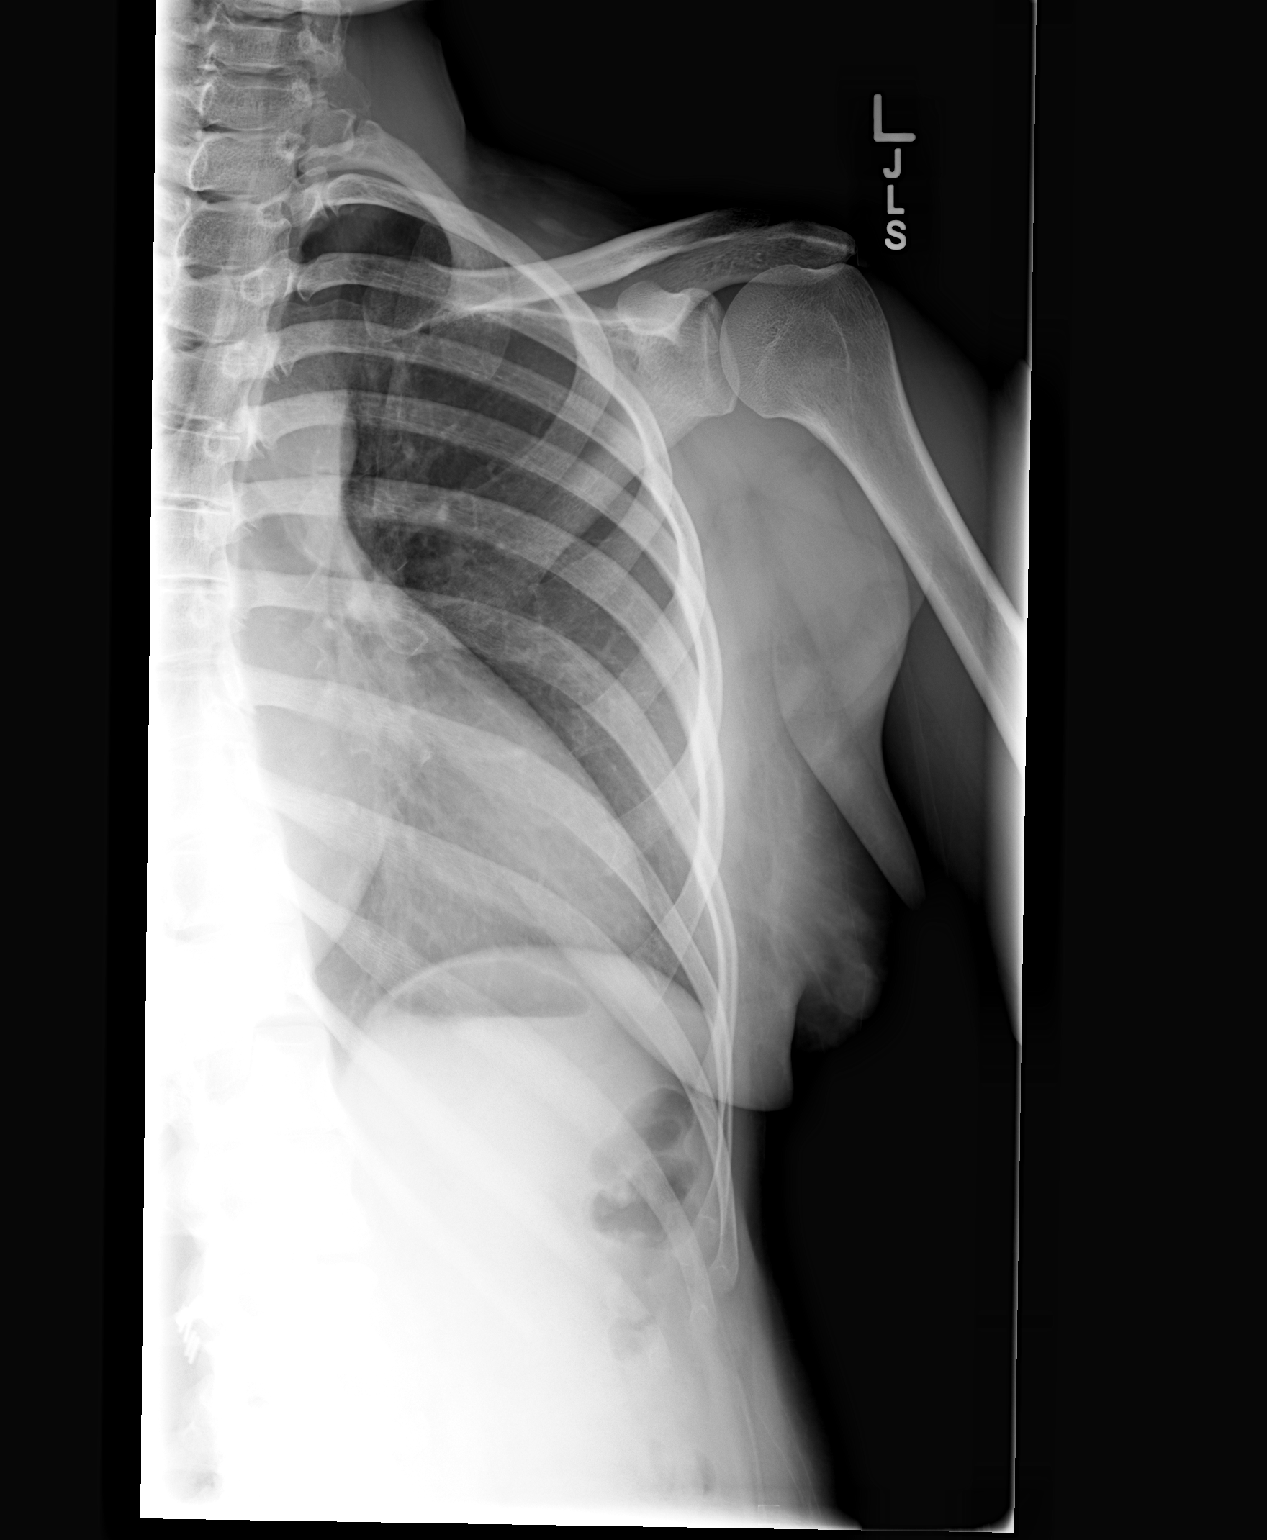

[view not recorded (3 of 3)]
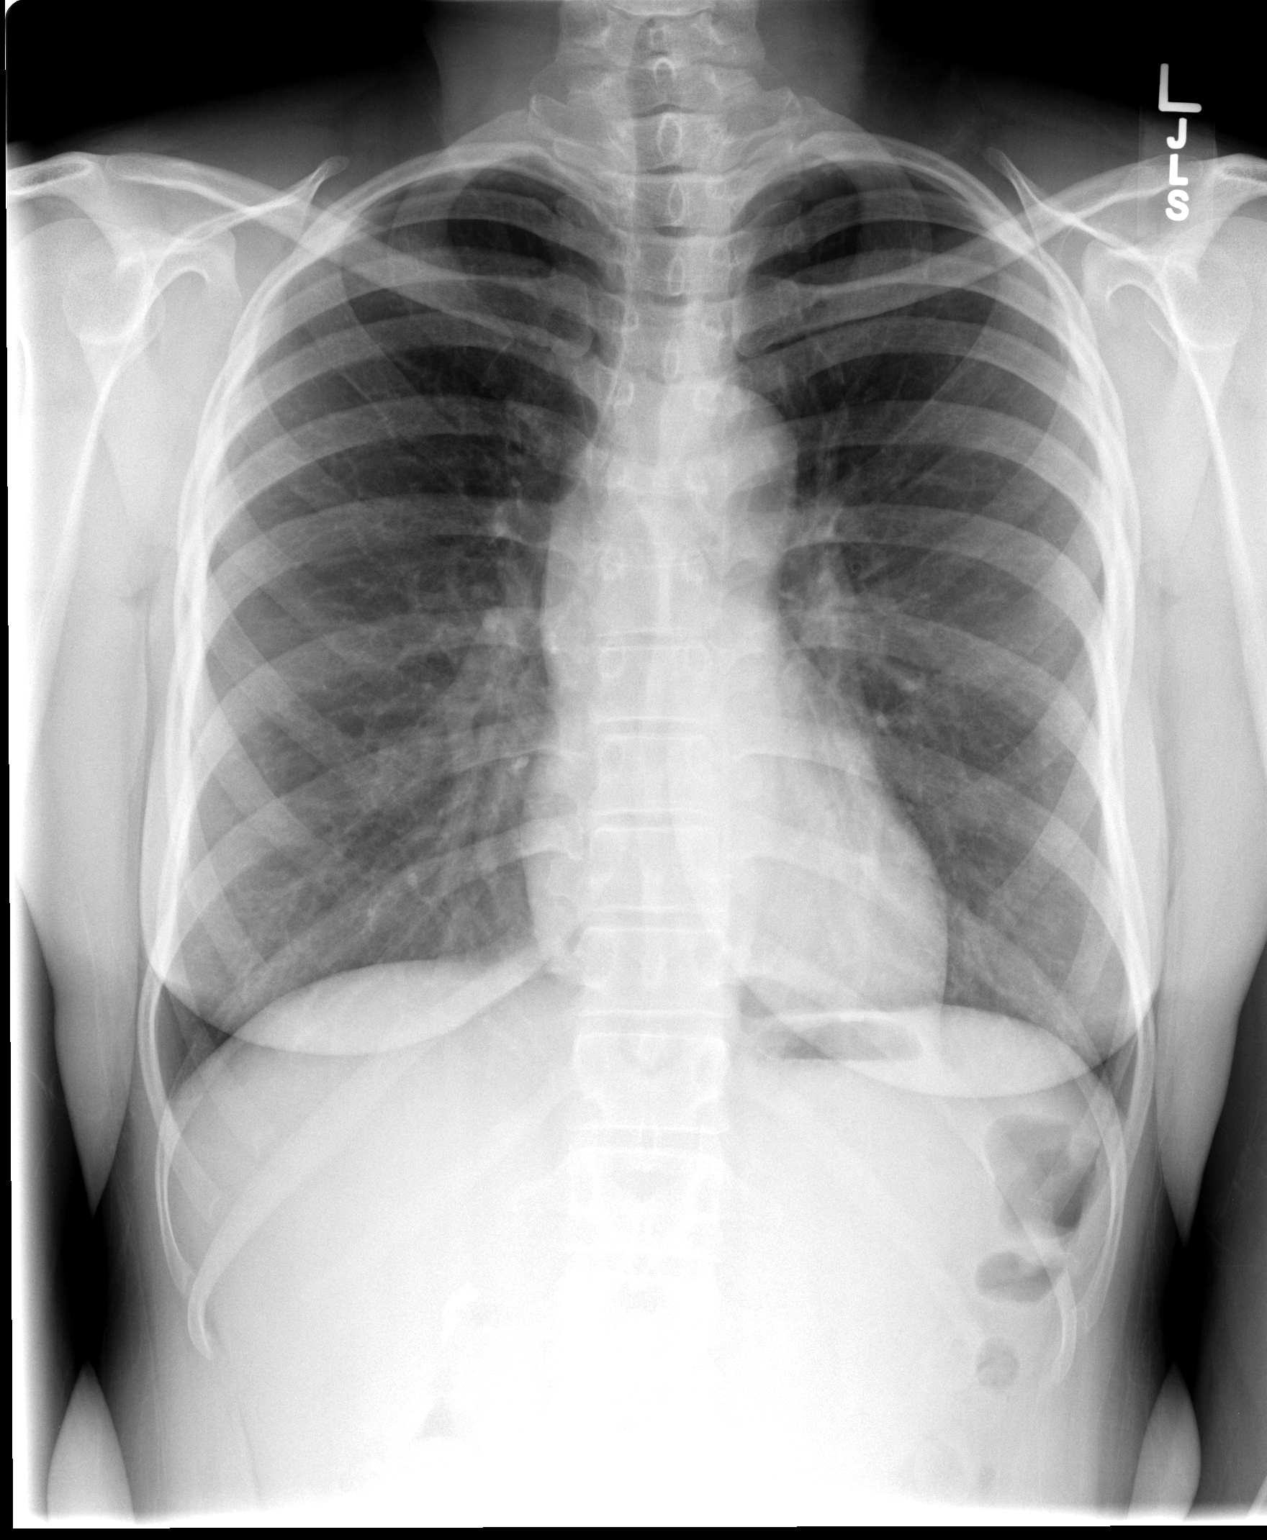

[3 of 3 positions shown; findings below may reference images not displayed]

FINDINGS: Heart size and mediastinal contours are normal.  There is no pleural fluid or pulmonary edema.  No airspace opacities identified. 
 Coned down views of the left ribs show no acute rib fractures.
IMPRESSION: No acute findings.

## 2009-11-14 ENCOUNTER — Emergency Department (HOSPITAL_COMMUNITY): Admission: EM | Admit: 2009-11-14 | Discharge: 2009-11-14 | Payer: Self-pay | Admitting: Emergency Medicine

## 2009-12-22 ENCOUNTER — Ambulatory Visit (HOSPITAL_COMMUNITY): Admission: RE | Admit: 2009-12-22 | Discharge: 2009-12-22 | Payer: Self-pay | Admitting: Internal Medicine

## 2009-12-24 ENCOUNTER — Encounter: Admission: RE | Admit: 2009-12-24 | Discharge: 2009-12-24 | Payer: Self-pay | Admitting: Internal Medicine

## 2009-12-28 ENCOUNTER — Encounter: Admission: RE | Admit: 2009-12-28 | Discharge: 2009-12-28 | Payer: Self-pay | Admitting: Internal Medicine

## 2009-12-28 ENCOUNTER — Encounter (INDEPENDENT_AMBULATORY_CARE_PROVIDER_SITE_OTHER): Payer: Self-pay | Admitting: Internal Medicine

## 2010-04-23 ENCOUNTER — Emergency Department (HOSPITAL_COMMUNITY): Admission: EM | Admit: 2010-04-23 | Discharge: 2010-04-23 | Payer: Self-pay | Admitting: Emergency Medicine

## 2010-05-07 ENCOUNTER — Emergency Department (HOSPITAL_COMMUNITY): Admission: EM | Admit: 2010-05-07 | Discharge: 2010-05-08 | Payer: Self-pay | Admitting: Emergency Medicine

## 2010-06-06 ENCOUNTER — Emergency Department (HOSPITAL_COMMUNITY): Admission: EM | Admit: 2010-06-06 | Discharge: 2010-06-06 | Payer: Self-pay | Admitting: Family Medicine

## 2010-08-10 ENCOUNTER — Emergency Department (HOSPITAL_COMMUNITY): Admission: EM | Admit: 2010-08-10 | Discharge: 2010-08-10 | Payer: Self-pay | Admitting: Family Medicine

## 2010-10-06 ENCOUNTER — Emergency Department (HOSPITAL_COMMUNITY)
Admission: EM | Admit: 2010-10-06 | Discharge: 2010-10-06 | Payer: Self-pay | Source: Home / Self Care | Admitting: Emergency Medicine

## 2010-10-31 ENCOUNTER — Encounter: Payer: Self-pay | Admitting: Internal Medicine

## 2010-10-31 ENCOUNTER — Encounter: Payer: Self-pay | Admitting: Gastroenterology

## 2010-12-13 ENCOUNTER — Other Ambulatory Visit (HOSPITAL_COMMUNITY): Payer: Self-pay | Admitting: Internal Medicine

## 2010-12-13 DIAGNOSIS — Z1231 Encounter for screening mammogram for malignant neoplasm of breast: Secondary | ICD-10-CM

## 2010-12-28 ENCOUNTER — Ambulatory Visit (HOSPITAL_COMMUNITY)
Admission: RE | Admit: 2010-12-28 | Discharge: 2010-12-28 | Disposition: A | Discharge status: Home / Self Care | Payer: Medicare Other | Source: Ambulatory Visit | Attending: Internal Medicine | Admitting: Internal Medicine

## 2010-12-28 DIAGNOSIS — Z1231 Encounter for screening mammogram for malignant neoplasm of breast: Secondary | ICD-10-CM | POA: Insufficient documentation

## 2010-12-29 LAB — URINALYSIS, ROUTINE W REFLEX MICROSCOPIC
Glucose, UA: NEGATIVE mg/dL
Hgb urine dipstick: NEGATIVE
Ketones, ur: 15 mg/dL — AB
Nitrite: NEGATIVE
Protein, ur: 100 mg/dL — AB
Urobilinogen, UA: 1 mg/dL (ref 0.0–1.0)

## 2010-12-29 LAB — COMPREHENSIVE METABOLIC PANEL
ALT: 35 U/L (ref 0–35)
AST: 40 U/L — ABNORMAL HIGH (ref 0–37)
Albumin: 4.1 g/dL (ref 3.5–5.2)
Alkaline Phosphatase: 98 U/L (ref 39–117)
BUN: 12 mg/dL (ref 6–23)
Calcium: 8.9 mg/dL (ref 8.4–10.5)
Creatinine, Ser: 0.98 mg/dL (ref 0.4–1.2)
Glucose, Bld: 103 mg/dL — ABNORMAL HIGH (ref 70–99)
Sodium: 141 mEq/L (ref 135–145)
Total Bilirubin: 0.2 mg/dL — ABNORMAL LOW (ref 0.3–1.2)
Total Protein: 8.4 g/dL — ABNORMAL HIGH (ref 6.0–8.3)

## 2010-12-29 LAB — CBC
HCT: 46.6 % — ABNORMAL HIGH (ref 36.0–46.0)
Hemoglobin: 16 g/dL — ABNORMAL HIGH (ref 12.0–15.0)
MCHC: 34.4 g/dL (ref 30.0–36.0)
MCV: 93.3 fL (ref 78.0–100.0)
Platelets: 124 10*3/uL — ABNORMAL LOW (ref 150–400)
RBC: 5 MIL/uL (ref 3.87–5.11)
RDW: 13 % (ref 11.5–15.5)
WBC: 2.9 10*3/uL — ABNORMAL LOW (ref 4.0–10.5)

## 2010-12-29 LAB — URINE CULTURE: Colony Count: 45000

## 2010-12-29 LAB — DIFFERENTIAL
Basophils Absolute: 0 10*3/uL (ref 0.0–0.1)
Basophils Relative: 0 % (ref 0–1)
Eosinophils Absolute: 0.1 10*3/uL (ref 0.0–0.7)
Lymphocytes Relative: 47 % — ABNORMAL HIGH (ref 12–46)
Lymphs Abs: 1.3 10*3/uL (ref 0.7–4.0)
Monocytes Relative: 19 % — ABNORMAL HIGH (ref 3–12)
Neutro Abs: 0.9 10*3/uL — ABNORMAL LOW (ref 1.7–7.7)
Neutrophils Relative %: 32 % — ABNORMAL LOW (ref 43–77)

## 2010-12-29 LAB — PATHOLOGIST SMEAR REVIEW

## 2010-12-29 LAB — LIPASE, BLOOD: Lipase: 25 U/L (ref 11–59)

## 2010-12-29 LAB — URINE MICROSCOPIC-ADD ON

## 2011-02-22 NOTE — Discharge Summary (Signed)
Shannon Mayo, ZADROZNY           ACCOUNT NO.:  [id number]   MEDICAL RECORD NO.:  [id number]          PATIENT TYPE:  INP   LOCATION:  6715                         FACILITY:  [facility]   PHYSICIAN:  Alvester Morin, M.D.  DATE OF BIRTH:  27-Feb-1966   DATE OF ADMISSION:  05/06/2008  DATE OF DISCHARGE:  05/09/2008                               DISCHARGE SUMMARY   DISCHARGE DIAGNOSES:  1. Cellulitis.  Status post incision and drainage of boil on the      abdomen and right thigh.  2. History of boil with methicillin-resistant Staphylococcus aureus.  3. Transaminitis, perhaps related to Doxycycline.  4. Chronic migraines.  5. Fibromyalgia.  6. Gastroesophageal reflux disease.  7. Irritable bowel syndrome.  8. Depression.  9. Hypertension.  10.Status post cholecystectomy in August 2001.  11.History of tubal ligation.   DISCHARGE MEDICATIONS:  1. Trimethoprim 40 mg/sulfamethoxazole 200 mg per 5 mL liquid, take 20      mL q.12 hours for 12 days.  2. Neurontin 600 mg p.o. twice daily.  3. Nexium 40 mg p.o. daily.  4. Allegra 180 mg p.o. daily.  5. Wellbutrin XL 150 mg p.o. daily.  6. Lexapro 10 mg p.o. daily.  7. Trazodone 50 mg p.o. nightly.  8. Relpax 1 mg p.o. p.r.n. migraine.  9. Lidoderm patch 5% as used previously.  10.Flexeril 10 mg p.o. p.r.n. pain.  11.Vicodin 5/500 mg tablets 1 tablet p.o. q.4 p.r.n. pain.  12.Hydrochlorothiazide 25 mg p.o. daily.  13.Promethazine 12.5 mg p.o. q.6 hours p.r.n. nausea.   DISCHARGE CONDITION AND FOLLOWUP:  At the time of discharge, the  patient's boils and cellulitis are both improved.  She has received both  vancomycin IV as well as Doxycycline p.o. and the boils were looking  considerably better.  She was discharged with a prescription for Bactrim  liquid, which she was to take twice daily for 12 days.   The patient was found incidentally, prior to discharge, to have an acute  elevation in her liver transaminases and alkaline phosphatase.   Although  no definite cause was determined, Doxycycline was held and the following  day, her LFTs were total bilirubin 0.9, alkaline phosphatase 183, AST  83, ALT 176.  These were down from alkaline phosphatase 255, AST 190,  and ALT 286.  The patient has been ordered for a CMET prior to her  clinic appointment to verify that her transaminases are continuing to  trend down and have not become elevated further.  The last item is that,  on the day of discharge, the patient reported some suprapubic pain and  was found to have a completely normal urinalysis.  She never had any  urinary symptoms.  However, the preliminary results of the culture was  10,000 cultures of proteus mirabilis.  If the patient has any urinary  symptoms, or the result of the culture changes, treatment could be  considered.   The patient has an appointment to see Dr. Reynold Bowen in the Outpatient  Clinic on May 12, 2008 at 2:30 p.m.  The patient was to follow up in  approximately 2 weeks with Dr. Freida Busman  of surgery, and was provided with  the phone number to call for this appointment.   PROCEDURES PERFORMED:  The patient received an incision and drainage of  her boils by Dr. Freida Busman of surgery.   CONSULTATIONS:  Dr. Lennie Muckle of surgery was consulted.   BRIEF ADMISSION HISTORY AND PHYSICAL:  The patient came to the  outpatient clinic concerned about 2 boils, 1 on her right thigh, and the  other on her abdomen in the right lower quadrant.  She noticed it  several days earlier and has been thinking it might be a spider bite.  She went to the [facility] the night prior to  admission.  There, they recommended an incision and drainage, but she  went home because she did not want to have it drained, and had a lot of  anxiety about this.  The boils have been approximately stable, but they  were not getting any better.  They were swollen and tender to the touch,  and she had not noted any discharge.   The patient did have a similar  episode in the past in November of 2008 when she had a boil on her right  buttock, was hospitalized for it, and the cultures grew MRSA.  At that  time, the infection was treated with clindamycin.   PHYSICAL EXAM:  VITAL SIGNS:  Temperature 98.3, pulse 77, blood pressure  160/94, respirations 18, oxygen saturation 99% on room air.  LUNGS:  Clear.  HEART:  Regular rate and rhythm without murmurs, rubs, or gallops.  ABDOMEN:  Soft.  She had an area of erythema and induration  approximately 5 cm located on her right lower quadrant, which was  painful to palpation, though with no fluctuance.  She also had on her  right thigh, approximately 5 cm lesion of erythema and induration.   LABS:  White blood cell count 6.8, hemoglobin 12.1, platelets 180,000.  Sodium 137, potassium 4.0, chloride 103, bicarbonate 29, glucose 99, BUN  6, creatinine 0.75, bilirubin 0.6, alkaline phosphatase 84, AST 27, ALT  26, total protein 6.8, albumin 2.5, calcium 9.2.  TSH was checked and  found to be 1.033.   HOSPITAL COURSE:  1. Abscesses.  The patient's abscesses were incised and drained by Dr.      Freida Busman of surgery.  The wounds were packed and the patient was      treated initially with intravenous vancomycin.  The patient's      wounds looked quite good the following morning, and she was      switched to Doxycycline p.o.  However, the patient and her family      had a great deal of anxiety about her wounds, and the patient was      also having nausea and vomiting that morning, so she was kept in      the hospital for an additional day of observation.  The following      morning a routine CMET showed that she had an acute elevation in      her transaminases (see below).  She was, therefore, given another      dose of vancomycin and Doxycycline was held.  When her      transaminitis began to resolve on the following day, she was      discharged with a prescription for Bactrim  liquid.  The rationale      for choosing this medication is as follows:  There was no culture  or susceptibility data from this admission to guide antibiotic      selection.  There was a culture from January that showed MRSA that      was resistant to erythromycin.  Since the patient's transaminitis      coincided with the administration of Doxycycline, it was chosen not      to use this medication.  Since the MRSA in the past was resistant      to erythromycin, and no susceptibility testing for clindamycin had      been done, we decided not to use clindamycin for fear that her MRSA      might develop resistance.  This left Bactrim, and the patient      stated she was unable to take the large pills by mouth, so liquid      Bactrim was selected.  2. Transaminitis.  On admission, the patient's alkaline phosphatase      was 84, AST 27, and ALT 26.  After receiving 2 doses of      Doxycycline, as well as the rest of her home medications, some pain      medications, and also vancomycin, on her second hospital day,      routine CMET showed alkaline phosphatase 241, AST 197, ALT 280.  A      stat lab was drawn to repeat the CMET to verify this result, and      showed nearly identical results.  A lipase was checked, which was      16.  An acute hepatitis panel was drawn, which showed that she was      negative for hepatitis B, hepatitis C, hepatitis A.  Doxycycline      was held and a CMET the following day showed alkaline phosphatase      183, AST 83, ALT 176.  The patient was discharged with an      appointment to follow up in the outpatient clinic for a recheck of      her liver enzymes.   DISCHARGE LABORATORIES AND VITALS:  Temperature 97.6, pulse 81,  respirations 19, blood pressure 117/92, 96% oxygen saturation on room  air.  White blood count 4.5, hemoglobin 12.2, platelets 186,000.  Sodium  136, potassium 3.4, chloride 102, bicarbonate 30, glucose 100, BUN 4,  creatinine 0.82,  bilirubin 0.9, alkaline phosphatase 183, AST 83, ALT  176, protein 6.7, albumin 3.3, calcium 8.6.   PENDING LABS:  At the time of this dictation, the preliminary urine  culture result that showed 10,000 colonies of proteus.      Loel Dubonnet, MD  Electronically Signed      Alvester Morin, M.D.  Electronically Signed    PN/MEDQ  D:  05/11/2008  T:  05/11/2008  Job:  [id number]   cc:   Lennie Muckle, MD  [facility]

## 2011-02-22 NOTE — Consult Note (Signed)
[name], LOSS           ACCOUNT NO.:  [id number]   MEDICAL RECORD NO.:  [id number]          PATIENT TYPE:  INP   LOCATION:  6715                         FACILITY:  [facility]   PHYSICIAN:  Lennie Muckle, MD      DATE OF BIRTH:  1966-07-22   DATE OF CONSULTATION:  05/06/2008  DATE OF DISCHARGE:                                 CONSULTATION   REASON FOR CONSULTATION:  Abdominal and right thigh abscess.   HISTORY OF PRESENT ILLNESS:  [name] is a 45 year old female who was  seen in the Internal Medicine Clinic today due to swelling and pain,  both on her abdominal wall and her right thigh.  Apparently, she had had  a small area noted last Wednesday, which enlarged in size as well as  pain and erythema.  She tells me that this had been decreasing in size,  but due to her previous abscess on her right buttock with MRSA, she came  to the Internal Medicine Clinic.  She has had no fevers or chills, had  no trauma to the area.  She was apparently seen at [facility] Emergency  Department where I and D was recommended, but she declined.  She now is  having more pain in her abdomen and wishes to have this taken care of.  She has no history of diabetes.   PAST MEDICAL HISTORY:  Significant for MRSA in November 2008,  hypertension, irritable bowel syndrome, depression, fibromyalgia,  gastroesophageal reflux disease, and gastritis.   SURGICAL HISTORY:  She had a cholecystectomy and a tubal ligation.   MEDICATIONS:  Multiple.  Please see the chart for this.   ALLERGIES:  IBUPROFEN as well as MORPHINE upset her stomach.   SOCIAL HISTORY:  No tobacco use.  No alcohol use.  She is divorced and  she lives with her son.   FAMILY HISTORY:  No significant history.  Her father had a CVA.   REVIEW OF SYSTEMS:  I gained this from the patient's chart.  She has  depression, anxiety, and headache.   PHYSICAL EXAMINATION:  GENERAL:  She is a pleasant well-developed, well-  nourished female in  no acute distress.  VITAL SIGNS:  Temperature 98, blood pressure 141/93, and pulse 95.  ABDOMEN:  Soft.  There is approximately 4 cm to 5 cm area of erythema  and induration on her right lower abdominal wall.  This is painful to  palpation.  There is a small whitish pinpoint area in the middle of  this.  EXTREMITIES:  On her right thigh, there is approximately 6-cm lesion of  erythema and induration, also with a small pinpoint area in this  vicinity as well.   ASSESSMENT/PLAN:  Likely abdominal wall and right thigh abscess.  I  discussed with the patient.  I would like to perform incision and  drainage.   PROCEDRE: I  anesthetized the area with 1% lidocaine at the abdominal  wall and the right thigh.  Using #11 blade, I placed incision around the  vicinity of the white pinpoint area.  There was a small amount of  purulent fluid along  the abdominal wall and a pocket, which I packed  with gauze.  The right thigh only had a pocket, but no purulent fluid  expressed.   I packed both these with gauze.  The patient will receive vancomycin due  to her previous history of MRSA.  Continue with packing and dressing  changes daily, and hopefully, over the next few days, we will have  resolution of the erythema to be able to be discharged home possibly  with dressing changes, but we will continue to monitor.      Lennie Muckle, MD  Electronically Signed     ALA/MEDQ  D:  05/06/2008  T:  05/07/2008  Job:  [id number]

## 2011-02-22 NOTE — Discharge Summary (Signed)
Shannon Mayo, WEIR NO.:  [id number]   MEDICAL RECORD NO.:  [id number]          PATIENT TYPE:  INP   LOCATION:  3037                         FACILITY:  [facility]   PHYSICIAN:  Mick Sell, MD DATE OF BIRTH:  05-20-1966   DATE OF ADMISSION:  05/26/2008  DATE OF DISCHARGE:  05/28/2008                               DISCHARGE SUMMARY   DISCHARGE DIAGNOSES   1. Recurrent boils, with history of methicillin-resistant      Staphylococcus aureus skin infection.  2. Migraine headaches.  3. Hypertension.  4. Depression.  5. Fibromyalgia.  6. Transaminitis secondary to medications.   DISCHARGE MEDICATIONS   1. Doxycycline 100 mg 1 pill 2 times daily for 8 days.  2. Nexium 40 mg 1 capsule once daily.  3. Allegra 180 mg 1 tablet once daily.  4. Wellbutrin XL 150 mg 1 pill once daily.  5. Lexapro 10 mg 1 pill once daily.  6. Trazodone 50 mg 1 pill at bedtime.  7. Relpax 20 mg 1 pill by mouth for acute migraine.  8. Lidoderm 5%  patches, apply 2 times daily as needed.  9. Cyclobenzaprine 10 mg 1 pill 3 times daily.  10.Vicodin 5/500 mg 1 pill every 4 hours as needed for back pain.  11.Hydrochlorothiazide 1 pill once daily.  12.Ativan 0.5 mg 1 pill at bedtime as needed.  13.Promethazine 12.5 mg 1 pill every 6 hours as needed for nausea.  14.Percocet 5/325 mg 1 pill every 6 hours as needed for headaches.  15.Bactroban nasal 2%, apply to each nostril 2 times daily for 2      weeks.  16.Chlorhexidine 4% liquid apply all over the body excluding the head.   DISPOSITION AND FOLLOWUP   The patient is to follow up with the Outpatient Clinic at [facility] with Dr. Andrey Campanile on June 19, 2008, at 2 p.m., phone number  for the Outpatient Clinic is [phone], and the patient is to get  CBC, CMET at followup on June 19, 2008.   PROCEDURES PERFORMED   1. CT scan of the head without contrast: no acute intracranial      findings and no interval changes  from prior CT scan.  2. Chest x-ray: no acute cardiopulmonary findings.  3. Blood cultures: negative.  4. Urine cultures: negative.  5. Urine drug screen: positive for hydrocodone and marijuana.  6. Urine pregnancy test is negative.   CONSULTATIONS   None.   ADMITTING HISTORY AND PHYSICAL   She is a 45 year old lady with past medical history of migraine  headaches, hypertension, recurrent boils, history of MRSA skin  infection, comes to the emergency room with chief complaint of fever  since yesterday, temperature of 105 constant, associated with chills,  nausea, and vomitings x3.  The vomitus is brownish colored and no blood  in it.  She denies any shortness of breath, cough, chest pain, diarrhea,  abdominal pain, and burning micturition.  She complains of headaches  since yesterday, constant, 10/10 in severity all over the head  associated with photophobia.  She denies any blurring of vision,  weakness, tingling, numbness, or phonophobia.  She complains of pain in  the left hand and left foot since yesterday, shooting-type of pain,  intermittent, no aggravating or relieving factors.  She complains of  decreased appetite, and she did not eat anything since last night.  She  has a history of migraines for almost 5 years and gets about 2-3  episodes per month.  She says that these headaches are different from  migraine headaches, and they are much severe than the migraine headaches  and not responding to her regular medications.  She was admitted for  boils last month and was I&D'ed and was discharged on May 11, 2008,  and was given Bactrim for 12 days.  She is allergic to ibuprofen,  morphine, and Dilaudid.   PAST MEDICAL HISTORY   Significant for arthritis, chronic back pain, depression, fibromyalgia,  GERD, migraine headaches, irritable bowel syndrome, MRSA skin  infections, hypertension, transaminitis, anxiety, and rheumatoid  arthritis.   LABORATORY DATA   White count  8.9, hemoglobin 12.8, hematocrit 37.8, platelet count 255,  and MCV 93.5.  Sodium 135, potassium 3.7, chloride 105, bicarb 25, BUN  7, creatinine 1.1, and glucose 108.  Urinalysis; urine color, yellow  appearance, cloudy, specific gravity 1.026, pH 8.5, urine glucose  negative, bilirubin negative, ketones negative, blood negative, proteins  negative, nitrites negative, and leukocytes small.  Prothrombin time  15.6, INR 1.2, and PTT of 36.   HOSPITAL COURSE BY PROBLEMS   1. Recurrent boils.  Given her previous history of recurrent boils and      MRSA skin infections, we started her on vancomycin and Tylenol for      fever.  She became nauseated after receiving the first dose of      vancomycin and then vomited a couple of times.  We changed her      antibiotic to daptomycin and stopped vancomycin.  The infection      responded quite nicely to the daptomycin.  The boils on her right      thigh and left thigh and boils over the back gradually resolved      during the hospital stay.  On the day of discharge, we changed her      antibiotics to doxycycline 100 mg 2 times a day for 8 days, and the      patient needs to be followed up in the Outpatient Clinic on      June 19, 2008 at 2 p.m. with Dr. Andrey Campanile.  We gave her      Bactroban Nasal 2% solution to be applied to each nostril for 2      weeks to eradicate the methicillin-resistant Staph aureus in the      nasal flora along with chlorhexidine 4% liquid to be applied all      over the body while bathing excluding her head. On the day of      discharge, the patient is stable, alert, and oriented x3 and is not      in any acute distress.   1. Headaches, likely migraine headaches.  We did a CT scan of the head      without contrast to rule out any acute intracranial pathology.  CT      scan of the head came back negative.  We continued her home      medications, Vicodin and Percocet. Her headaches could be related      to aseptic  meningitis secondary to Bactrim use prior to the  hospital admission.  We did not do the lumbar puncture as the      patient was feeling better and on the second day of hospital      admission.  The patient did not have any meningeal signs, and      Kernig and Brudzinski signs were negative.  2. Hypertension.  We continued her home medication, which is      hydrochlorothiazide 25 mg.  3. Depression.  We continued her home medications, trazodone and      Lexapro.  4. Fibromyalgia.  We continued her home medications, Vicodin 5/500 mg      as well as Neurontin 600 mg.  5. Migraine headache.  We continued her home medications, Relpax 20      mg.  We also gave her some Tylenol as needed for headaches.  6. Prophylaxis.  We gave her SCDs and Protonix for the prophylaxis.  7. Transaminitis.  Likely secondary to the medication that she was      taking.  Transaminitis was found during the previous hospital      admission and was found to be related to doxycycline.  The patient      re-monitored her CMETs regularly.  On the day of admission, the      patient is stable, alert and oriented x3, and is not in any acute      distress.   DISCHARGE VITALS   Temperature 98.2, pulse rate 74, respiratory rate 18, systolic blood  pressure 118, diastolic 72, and oxygen saturation 96% on room air.   DISCHARGE LABORATORY DATA   WBC count 4.4, RBC 3.81, hemoglobin 12.0, hematocrit 35.2, MCV 92.4,  MCHC 34.1, RDW 13.1, and platelet count 208.  Comprehensive metabolic  panel; sodium 138, potassium 3.5, chloride 103, carbon dioxide 30,  glucose 122, BUN 5, creatinine 0.86, total bilirubin 0.5, alkaline  phosphatase 151, SGPT 123, total protein 7.1, albumin 3.3, and calcium  8.9.   On the day of discharge, the patient is alert, oriented, stable, and is  not in any acute distress.  The patient is to follow up with Dr. Andrey Campanile  in the Outpatient Clinic at [facility] on June 19, 2008, at 2 p.m.  and is  to get CBC and CMETs prior to seeing the physician.      Blondell Reveal, MD  Electronically Signed      Mick Sell, MD  Electronically Signed    VB/MEDQ  D:  06/04/2008  T:  06/05/2008  Job:  [id number]   cc:   Outpatient Clinic

## 2011-02-25 NOTE — Op Note (Signed)
[facility]  Patient:    Shannon Mayo, Shannon Mayo                MRN: [id number] Proc. Date: 06/05/00 Adm. Date:  [id number] Attending:  Meredith Leeds                           Operative Report  PREOPERATIVE DIAGNOSIS:  Acute cholecystitis.  POSTOPERATIVE DIAGNOSES:  Chronic cholecystitis, possible cholelithiasis.  OPERATION:  Laparoscopic cholecystectomy.  SURGEON:  Zigmund Daniel, M.D.  ANESTHESIA:  General.  CLINICAL NOTE:  The patient is a 45 year old black female with recent upper abdominal pain.  Liver tests were normal.  White count was normal.  She had right upper quadrant tenderness.  Gallbladder ultrasound showed thickened gallbladder wall with dependent sludge and small stones, but no ductal dilatation.  She was felt to have acute cholecystitis.  DESCRIPTION OF PROCEDURE:  After the patient was monitored and anesthetized, and after routine preparation and draping of the abdomen, I made a small infraumbilical incision at the site of previous laparoscopy and carefully entered the peritoneal cavity.  There were no adhesions.  I opened the midline enough to place a pursestring 0 Vicryl suture and put in a Hasson cannula and get pneumoperitoneum.  There were no inflammatory changes evident either of the gallbladder or other areas, and the gallbladder was not distended.  There was no free fluid.  The pelvic structures appeared to be normal.  I put in three additional ports and then grasped the fundus of the gallbladder and elevated it.  I placed the patient in the head up, foot down, and left tilted position.  I took down adhesions which appeared to be of a chronic nature of the omentum and duodenum to the undersurface of the gallbladder and liver using scissors and cautery.  I then grasped the infundibulum of the gallbladder and retracted it laterally and dissected further down into the hepatoduodenal ligament, clearly identifying the  cystic duct as it emerged from the gallbladder and identifying the cystic artery.  I clipped the cystic duct with four clips and cut between the two closer to the gallbladder.  I clipped the cystic artery with three clips and cut between the two closest to the gallbladder.  I then dissected further up along the plane between the liver and the gallbladder using the cautery.  I identified another small artery coursing into the gallbladder, and I clipped and divided it.  I removed the gallbladder from the liver intact.  I thoroughly irrigated the operative area and removed the irrigant and felt that hemostasis was good.  Then I removed the gallbladder through the umbilical incision and tied the pursestring suture.  I anesthetized all of the incisions with long-acting local anesthetic and removed the ports under direct vision laterally.  I removed the CO2 and removed the epigastric port.  I closed the skin of all incisions with intracuticular 4-0 Vicryl and Steri-Strips.  The patient tolerated the operation well.  I opened the gallbladder and the basin and found that the bile was thick, but I did not notice any gallstones or other abnormality within the gallbladder. DD:  06/05/00 TD:  06/06/00 Job: [id number]

## 2011-02-25 NOTE — Discharge Summary (Signed)
[facility]  Patient:    Shannon Mayo, Shannon Mayo                MRN: [id number] Adm. Date:  [id number] Disc. Date: [id number] Attending:  Meredith Leeds                           Discharge Summary  HISTORY:  The patient is a 45 year old black female presenting with severe upper abdominal pain.  Her tests were normal.  White count was normal.  There was right upper quadrant tenderness.  Ultrasound showed a thickened gallbladder wall with dependent sludge and small stones but no ductal dilatation. She was felt to have acute cholecystitis.  Exam was unremarkable except for right upper quadrant abdominal tenderness.  HOSPITAL COURSE:  On the day of admission, I took the patient to the operating room and performed laparoscopy and laparoscopic cholecystectomy.  I did not find that the gallbladder had any type of remarkable appearance other than perhaps slight edema.  The operation was unremarkable.  On pathology, no gallstones were identified, but the gallbladder was shown to have acute and chronic coleocystitis.  The patient did not feel well on the first postoperative day.  She had some vomiting and continued pain.  I obtained a repeat white blood cell count which was normal.  Hemoglobin had not fallen, and the liver tests again were normal.  The next day, she felt much better.  I sent her home and arranged for her to see me in the office in three to four weeks or as necessary.  DIAGNOSIS:  Acute and chronic cholecystitis.  OPERATION:  Laparoscopic cholecystectomy.  DISCHARGE CONDITION:  Improved. DD:  06/19/00 TD:  06/20/00 Job: [id number]

## 2011-02-25 NOTE — Assessment & Plan Note (Signed)
[facility]                         GASTROENTEROLOGY OFFICE NOTE   NAME:SNIPES, CHAYIL GANTT                  MRN:          [id number]  DATE:09/25/2006                            DOB:          06/30/1966    REFERRING PHYSICIAN:  Yetta Barre, M.D.   REASON FOR REFERRAL:  Chronic epigastric pain and a history of hemoccult  positive stool.   HISTORY OF PRESENT ILLNESS:  Mrs. Shannon Mayo is a 45 year old African-  American female followed by Dr. Clyde Lundborg.  She has been evaluated  previously by Dr. Carman Ching and underwent colonoscopy on November 09, 2005.  Marked diverticular disease was noted and the colonoscopy was  incomplete.  The extent of the exam as felt to be the proximal  transverse colon.  His note suggested the possibility of scheduling a  barium enema but this was not done.  The patient has also been evaluated  at [facility].  She states irritable bowel syndrome  was diagnosed and she does not recall the name of the physician or the  exact workup that she had performed there.  Unfortunately, I have no  referral records from her evaluation at [facility] in 2005 at the time of this  dictation.  She states she has had problems with epigastric pain for  many years, this waxes and wanes and is occasionally associated with  episodes of diarrhea or episodes of nausea and vomiting.  She is status  post cholecystectomy in 2000 and states that gallstones were not found.  She relates occasional difficulties swallowing solid foods and pills and  she has been maintained on Nexium for quite some time for presumed acid  reflux.  She has significant problems with abdominal bloating.  She  relates no change in stool caliber, change in bowel habits, melena,  hematochezia or weight loss.  No family history of colon cancer, colon  polyps or inflammatory bowel disease.   PAST MEDICAL HISTORY:  Depression, anxiety, arthritis, irritable bowel  syndrome, headaches, status post cholecystectomy in 2000, status post  bilateral tubal ligation, status post neck surgery, fibromyalgia,  seasonal allergies.   MEDICATION ALLERGIES:  To IBUPROFEN and IVP DYE.   MEDICATIONS:  Listed on the chart, updated and reviewed.   SOCIAL HISTORY:  Reviewed.   REVIEW OF SYSTEMS:  Per the hand written form.   PHYSICAL EXAMINATION:  A well-developed, well-nourished African-American  female in no acute distress.  Height 5 feet 4 inches, weight 143.8 pounds. Blood pressure 150/90,  pulse 70 and regular.  HEENT:  Anicteric sclerae, oropharynx clear.  CHEST:  Clear to auscultation bilaterally.  CARDIAC:  Regular rate and rhythm without murmurs appreciated.  ABDOMEN:  Is soft with mild epigastric tenderness to deep palpation.  No  rebound or guarding, no palpable organomegaly, masses or hernias.  Normoactive bowel sounds.  RECTAL:  Deferred.  EXTREMITIES:  Without clubbing, cyanosis or edema.  NEUROLOGIC:  Alert and oriented x3. Grossly nonfocal.   ASSESSMENT AND PLAN:  Chronic epigastric pain with a history of  hemoccult positive stool.  She carries a diagnoses of irritable bowel  syndrome, gastroesophageal reflux syndrome and  diverticulosis.  Rule out  inflammatory bowel disease, ulcer disease and occult colonic lesions  missed on the prior incomplete colonoscopy.  She is to maintain a high  fiber diet with adequate fluid intake and continue on Nexium 40 mg  daily.  Schedule an air-contrasted barium enema.  Schedule upper  endoscopy with possible biopsy and possible dilation.  Risk, benefits  and alternatives discussed with the patient and she consents to proceed.  This will be scheduled electively.  Obtain a CBC, CMET, amylase and  lipase today.     Venita Lick. Russella Dar, MD, [facility]  Electronically Signed    MTS/MedQ  DD: 09/25/2006  DT: 09/25/2006  Job #: [id number]   cc:   Yetta Barre, M.D.

## 2011-02-25 NOTE — Op Note (Signed)
NAME:  Shannon Mayo, Shannon Mayo           ACCOUNT NO.:  [id number]   MEDICAL RECORD NO.:  [id number]          PATIENT TYPE:  AMB   LOCATION:  ENDO                         FACILITY:  [facility]   PHYSICIAN:  James L. Malon Kindle., M.D.DATE OF BIRTH:  05-Aug-1966   DATE OF PROCEDURE:  11/09/2005  DATE OF DISCHARGE:                                 OPERATIVE REPORT   PROCEDURE:  Colonoscopy.   MEDICATIONS GIVEN:  Phenergan 12.5 mg IV, Fentanyl 75 mcg IV, Versed 7.5 mg  IV.   INDICATIONS FOR PROCEDURE:  The patient is a 45 year old who has had  abdominal bloating and pain going on for sometime.  A colonoscopy is being  done to evaluate since she has positive stools, high impaction versus a mass  on physical, treated with Dulcolax as an outpatient, thus the colonoscopy.  This procedure is done to evaluate the colon further.   DESCRIPTION OF PROCEDURE:  The procedure was explained and patient consent  obtained.  Digital exam was performed and the mass that was felt initially  on evaluation was not present.  The scope was inserted and advanced.  The  patient has fairly large diverticulosis with a large amount of retained hard  stool that we eventually were able to get around.  We were able to advance  over to what was felt to be the proximal transverse colon.  The patient had  a large quantity of liquid and solid stool and, in essence, it was nearly  impossible to rule out any kind of gross lesion at this point.  The  procedure was terminated at this point.  The scope was withdrawn.  There  were no gross lesions seen throughout the colon.  The mucosa was normal  revealing a large amount of stool retained, ruled out any clear viewing of  the colon.  Th scope was withdrawn.  The patient tolerated the procedure  well and was resting comfortably at the termination of the procedure.  The  colon was decompressed as much as possible.   ASSESSMENT:  Heme positive stool with incomplete colonoscopy, 792.1.   She  has marked diverticular disease and probably a very sluggish colon.  Will  continue on Glycolax twice daily.  Will see back in the office in 3-4 weeks  and probably obtain a barium enema.           ______________________________  Llana Aliment. Malon Kindle., M.D.     Waldron Session  D:  11/09/2005  T:  11/09/2005  Job:  [id number]   cc:   Clare Charon, M.D.  Fax: [phone]

## 2011-05-31 ENCOUNTER — Inpatient Hospital Stay (INDEPENDENT_AMBULATORY_CARE_PROVIDER_SITE_OTHER)
Admission: RE | Admit: 2011-05-31 | Discharge: 2011-05-31 | Disposition: A | Discharge status: Home / Self Care | Payer: Medicare Other | Source: Ambulatory Visit | Attending: Family Medicine | Admitting: Family Medicine

## 2011-05-31 DIAGNOSIS — R071 Chest pain on breathing: Secondary | ICD-10-CM

## 2011-06-06 ENCOUNTER — Emergency Department (HOSPITAL_COMMUNITY): Payer: Medicare Other

## 2011-06-06 ENCOUNTER — Emergency Department (HOSPITAL_COMMUNITY)
Admission: EM | Admit: 2011-06-06 | Discharge: 2011-06-06 | Disposition: A | Discharge status: Home / Self Care | Payer: Medicare Other | Attending: Emergency Medicine | Admitting: Emergency Medicine

## 2011-06-06 ENCOUNTER — Encounter (HOSPITAL_COMMUNITY): Payer: Self-pay | Admitting: Radiology

## 2011-06-06 DIAGNOSIS — K5289 Other specified noninfective gastroenteritis and colitis: Secondary | ICD-10-CM | POA: Insufficient documentation

## 2011-06-06 DIAGNOSIS — K219 Gastro-esophageal reflux disease without esophagitis: Secondary | ICD-10-CM | POA: Insufficient documentation

## 2011-06-06 DIAGNOSIS — R1032 Left lower quadrant pain: Secondary | ICD-10-CM | POA: Insufficient documentation

## 2011-06-06 DIAGNOSIS — K625 Hemorrhage of anus and rectum: Secondary | ICD-10-CM | POA: Insufficient documentation

## 2011-06-06 DIAGNOSIS — M549 Dorsalgia, unspecified: Secondary | ICD-10-CM | POA: Insufficient documentation

## 2011-06-06 DIAGNOSIS — K589 Irritable bowel syndrome without diarrhea: Secondary | ICD-10-CM | POA: Insufficient documentation

## 2011-06-06 DIAGNOSIS — Z79899 Other long term (current) drug therapy: Secondary | ICD-10-CM | POA: Insufficient documentation

## 2011-06-06 DIAGNOSIS — F329 Major depressive disorder, single episode, unspecified: Secondary | ICD-10-CM | POA: Insufficient documentation

## 2011-06-06 DIAGNOSIS — F3289 Other specified depressive episodes: Secondary | ICD-10-CM | POA: Insufficient documentation

## 2011-06-06 DIAGNOSIS — R110 Nausea: Secondary | ICD-10-CM | POA: Insufficient documentation

## 2011-06-06 DIAGNOSIS — I10 Essential (primary) hypertension: Secondary | ICD-10-CM | POA: Insufficient documentation

## 2011-06-06 DIAGNOSIS — M129 Arthropathy, unspecified: Secondary | ICD-10-CM | POA: Insufficient documentation

## 2011-06-06 DIAGNOSIS — G8929 Other chronic pain: Secondary | ICD-10-CM | POA: Insufficient documentation

## 2011-06-06 HISTORY — DX: Irritable bowel syndrome, unspecified: K58.9

## 2011-06-06 HISTORY — DX: Essential (primary) hypertension: I10

## 2011-06-06 LAB — URINE MICROSCOPIC-ADD ON

## 2011-06-06 LAB — CBC
Hemoglobin: 15.5 g/dL — ABNORMAL HIGH (ref 12.0–15.0)
MCHC: 33.9 g/dL (ref 30.0–36.0)
RDW: 13.5 % (ref 11.5–15.5)
WBC: 8.1 10*3/uL (ref 4.0–10.5)

## 2011-06-06 LAB — ABO/RH: ABO/RH(D): A POS

## 2011-06-06 LAB — POCT PREGNANCY, URINE: Preg Test, Ur: NEGATIVE

## 2011-06-06 LAB — TYPE AND SCREEN
ABO/RH(D): A POS
Antibody Screen: NEGATIVE

## 2011-06-06 LAB — OCCULT BLOOD, POC DEVICE: Fecal Occult Bld: POSITIVE

## 2011-06-06 LAB — URINALYSIS, ROUTINE W REFLEX MICROSCOPIC
Bilirubin Urine: NEGATIVE
Hgb urine dipstick: NEGATIVE
Ketones, ur: NEGATIVE mg/dL
Nitrite: NEGATIVE
Protein, ur: NEGATIVE mg/dL
Urobilinogen, UA: 0.2 mg/dL (ref 0.0–1.0)

## 2011-06-06 LAB — DIFFERENTIAL
Basophils Absolute: 0 10*3/uL (ref 0.0–0.1)
Basophils Relative: 0 % (ref 0–1)
Eosinophils Relative: 3 % (ref 0–5)
Monocytes Absolute: 0.9 10*3/uL (ref 0.1–1.0)
Monocytes Relative: 12 % (ref 3–12)
Neutro Abs: 5.5 10*3/uL (ref 1.7–7.7)

## 2011-06-06 LAB — BASIC METABOLIC PANEL
BUN: 10 mg/dL (ref 6–23)
Calcium: 10.2 mg/dL (ref 8.4–10.5)
GFR calc non Af Amer: >60 mL/min (ref >60)
Glucose, Bld: 95 mg/dL (ref 70–99)

## 2011-07-07 LAB — COMPREHENSIVE METABOLIC PANEL
ALT: 24
Alkaline Phosphatase: 85
BUN: 8
CO2: 27
Calcium: 9.9
GFR calc Af Amer: >60
GFR calc non Af Amer: >60
Glucose, Bld: 97
Potassium: 5.5 — ABNORMAL HIGH
Sodium: 136
Total Bilirubin: 1.7 — ABNORMAL HIGH

## 2011-07-07 LAB — DIFFERENTIAL
Basophils Relative: 1
Eosinophils Absolute: 0.6
Lymphs Abs: 1
Neutro Abs: 3.4
Neutrophils Relative %: 60

## 2011-07-07 LAB — CBC
HCT: 45
Hemoglobin: 15.5 — ABNORMAL HIGH
MCHC: 34.5
RBC: 4.86

## 2011-07-07 LAB — URINALYSIS, ROUTINE W REFLEX MICROSCOPIC
Ketones, ur: NEGATIVE
Nitrite: NEGATIVE
Protein, ur: NEGATIVE

## 2011-07-07 LAB — URINE MICROSCOPIC-ADD ON

## 2011-07-07 LAB — LIPASE, BLOOD: Lipase: 19

## 2011-07-08 LAB — DIFFERENTIAL
Basophils Absolute: 0
Basophils Relative: 0
Basophils Relative: 0
Basophils Relative: 0
Eosinophils Absolute: 0.1
Eosinophils Absolute: 0.4
Eosinophils Relative: 2
Lymphocytes Relative: 16
Lymphs Abs: 0.7
Monocytes Absolute: 0.7
Monocytes Relative: 10
Monocytes Relative: 11
Neutro Abs: 3.7
Neutro Abs: 4.7
Neutrophils Relative %: 67
Neutrophils Relative %: 69
Neutrophils Relative %: 80 — ABNORMAL HIGH

## 2011-07-08 LAB — BASIC METABOLIC PANEL
CO2: 27
Calcium: 9.3
Creatinine, Ser: 0.7
GFR calc Af Amer: >60
GFR calc non Af Amer: >60
Glucose, Bld: 114 — ABNORMAL HIGH
Sodium: 136

## 2011-07-08 LAB — HEPATITIS PANEL, ACUTE
Hep A IgM: NEGATIVE
Hep B C IgM: NEGATIVE
Hepatitis B Surface Ag: NEGATIVE

## 2011-07-08 LAB — URINE CULTURE: Colony Count: 10000

## 2011-07-08 LAB — COMPREHENSIVE METABOLIC PANEL
ALT: 176 — ABNORMAL HIGH
ALT: 280 — ABNORMAL HIGH
ALT: 286 — ABNORMAL HIGH
AST: 190 — ABNORMAL HIGH
Albumin: 3.3 — ABNORMAL LOW
Albumin: 3.5
Alkaline Phosphatase: 183 — ABNORMAL HIGH
Alkaline Phosphatase: 241 — ABNORMAL HIGH
Alkaline Phosphatase: 84
BUN: 4 — ABNORMAL LOW
BUN: 4 — ABNORMAL LOW
BUN: 6
CO2: 31
CO2: 32
Calcium: 8.6
Calcium: 9.2
Calcium: 9.7
Calcium: 9.8
Chloride: 102
Chloride: 98
Chloride: 98
Creatinine, Ser: 0.82
Creatinine, Ser: 0.84
GFR calc Af Amer: >60
GFR calc Af Amer: >60
GFR calc Af Amer: >60
GFR calc non Af Amer: >60
GFR calc non Af Amer: >60
Glucose, Bld: 102 — ABNORMAL HIGH
Glucose, Bld: 96
Glucose, Bld: 99
Potassium: 3.4 — ABNORMAL LOW
Potassium: 4
Sodium: 137
Sodium: 138
Total Bilirubin: 0.9
Total Protein: 6.7
Total Protein: 6.8
Total Protein: 8

## 2011-07-08 LAB — CBC
HCT: 36.1
HCT: 36.3
HCT: 39.5
Hemoglobin: 12.1
Hemoglobin: 12.6
Hemoglobin: 13.3
MCHC: 33.5
MCHC: 33.6
MCV: 94.2
MCV: 94.4
MCV: 94.9
MCV: 95
Platelets: 168
Platelets: 180
Platelets: 186
RBC: 3.85 — ABNORMAL LOW
RBC: 3.93
RBC: 4.16
RDW: 13.3
RDW: 13.4
RDW: 13.6
WBC: 4.5
WBC: 5.5
WBC: 7

## 2011-07-08 LAB — TSH: TSH: 1.033

## 2011-07-08 LAB — PROTIME-INR: INR: 1

## 2011-07-08 LAB — URINALYSIS, ROUTINE W REFLEX MICROSCOPIC
Glucose, UA: NEGATIVE
Hgb urine dipstick: NEGATIVE
Ketones, ur: NEGATIVE
pH: 6

## 2011-07-08 LAB — LIPID PANEL
Cholesterol: 156
LDL Cholesterol: 89

## 2011-07-08 LAB — HIV ANTIBODY (ROUTINE TESTING W REFLEX): HIV: NONREACTIVE

## 2011-07-18 LAB — URINALYSIS, ROUTINE W REFLEX MICROSCOPIC
Bilirubin Urine: NEGATIVE
Glucose, UA: NEGATIVE
Hgb urine dipstick: NEGATIVE
Ketones, ur: NEGATIVE
Nitrite: NEGATIVE
Protein, ur: NEGATIVE
Specific Gravity, Urine: 1.012
Urobilinogen, UA: 0.2
pH: 7

## 2011-07-18 LAB — CBC
HCT: 37.6
Hemoglobin: 12.9
MCHC: 34.3
MCV: 92.2
Platelets: 209
RBC: 4.08
RDW: 13
WBC: 4.1

## 2011-07-18 LAB — POCT CARDIAC MARKERS
CKMB, poc: <1 — ABNORMAL LOW
Myoglobin, poc: 21.4
Operator id: 196461
Troponin i, poc: <0.05

## 2011-07-18 LAB — COMPREHENSIVE METABOLIC PANEL
ALT: 15
AST: 22
Alkaline Phosphatase: 64
BUN: 5 — ABNORMAL LOW
Chloride: 103
GFR calc non Af Amer: >60
Glucose, Bld: 80
Potassium: 3.7
Sodium: 139
Total Bilirubin: 0.8

## 2011-07-18 LAB — DIFFERENTIAL
Basophils Absolute: 0
Basophils Relative: 1
Eosinophils Absolute: 0.4
Eosinophils Relative: 9 — ABNORMAL HIGH
Lymphocytes Relative: 34
Lymphs Abs: 1.4
Monocytes Absolute: 0.5
Monocytes Relative: 12
Neutro Abs: 1.8
Neutrophils Relative %: 43

## 2011-07-18 LAB — COMPREHENSIVE METABOLIC PANEL WITH GFR
Albumin: 3.8
CO2: 24
Calcium: 9.4
Creatinine, Ser: 0.83
GFR calc Af Amer: >60
Total Protein: 7.4

## 2011-07-18 LAB — LIPASE, BLOOD: Lipase: 24

## 2011-07-19 LAB — POCT RAPID STREP A: Streptococcus, Group A Screen (Direct): NEGATIVE

## 2011-10-06 ENCOUNTER — Encounter (HOSPITAL_COMMUNITY): Payer: Self-pay | Admitting: Emergency Medicine

## 2011-10-06 ENCOUNTER — Emergency Department (INDEPENDENT_AMBULATORY_CARE_PROVIDER_SITE_OTHER)
Admission: EM | Admit: 2011-10-06 | Discharge: 2011-10-06 | Disposition: A | Discharge status: Home / Self Care | Payer: Medicare Other | Source: Home / Self Care | Attending: Family Medicine | Admitting: Family Medicine

## 2011-10-06 DIAGNOSIS — S43429A Sprain of unspecified rotator cuff capsule, initial encounter: Secondary | ICD-10-CM

## 2011-10-06 DIAGNOSIS — IMO0002 Reserved for concepts with insufficient information to code with codable children: Secondary | ICD-10-CM

## 2011-10-06 DIAGNOSIS — M545 Low back pain, unspecified: Secondary | ICD-10-CM

## 2011-10-06 HISTORY — DX: Gastro-esophageal reflux disease without esophagitis: K21.9

## 2011-10-06 HISTORY — DX: Depression, unspecified: F32.A

## 2011-10-06 HISTORY — DX: Major depressive disorder, single episode, unspecified: F32.9

## 2011-10-06 HISTORY — DX: Fibromyalgia: M79.7

## 2011-10-06 MED ORDER — CELECOXIB 200 MG PO CAPS: 200.0000 mg | ORAL_CAPSULE | Freq: Two times a day (BID) | ORAL | Status: DC

## 2011-10-06 MED ORDER — CYCLOBENZAPRINE HCL 10 MG PO TABS: 10.0000 mg | ORAL_TABLET | Freq: Three times a day (TID) | ORAL | Status: DC | PRN

## 2011-10-06 MED ORDER — TRAMADOL HCL 50 MG PO TABS: 50.0000 mg | ORAL_TABLET | Freq: Three times a day (TID) | ORAL | Status: AC | PRN

## 2011-10-06 NOTE — ED Notes (Signed)
PT HERE WITH RADIATING BACK PAIN THAT STARTED Friday NIGHT AFTER ASSISTING MOTHER IN SHOWER FROM FALLING.PT STATES SHE TWISTED BACK DUE TO DEAD WEIGHT MOM.SX STARTED IN LOWER BACK WITH ACHY PAIN NOW SHOOTING UP TO R SHOULDER AND GOING DOWN R ARM WITH NUMBNESS.PT STATES SX  WORSENS WITH BENDING OR TURNING.PT AHS TRIED AT HOME FLEXERIL AND TYLENOL BUT NOT WORKING

## 2011-10-06 NOTE — ED Provider Notes (Signed)
History     CSN: [id number]  Arrival date & time 10/06/11  4098   First MD Initiated Contact with Patient 10/06/11 1000      Chief Complaint  Patient presents with  . Back Pain  . Back Injury    (Consider location/radiation/quality/duration/timing/severity/associated sxs/prior treatment) HPI Comments: 45 y/o female h/o fibromyalgia here c/o right shoulder and neck pain as well as low back pain with no radiation after picking up her mother by herself from the bathtub (mother slipped from chair to the floor of the tub uninjured). Patient does not do this regularly and does not have training for lifting patients.    Past Medical History  Diagnosis Date  . Hypertension   . IBS (irritable bowel syndrome)   . Depression   . Fibromyalgia   . Acid reflux     History reviewed. No pertinent past surgical history.  History reviewed. No pertinent family history.  History  Substance Use Topics  . Smoking status: Never Smoker   . Smokeless tobacco: Not on file  . Alcohol Use: Yes    OB History    Grav Para Term Preterm Abortions TAB SAB Ect Mult Living                  Review of Systems  Constitutional: Negative.   HENT: Positive for neck pain. Negative for congestion, sore throat and trouble swallowing.   Respiratory: Negative for cough and shortness of breath.   Cardiovascular: Negative for chest pain, palpitations and leg swelling.  Gastrointestinal: Negative for abdominal pain.  Genitourinary: Negative for dysuria, frequency, hematuria and flank pain.  Musculoskeletal: Positive for myalgias and back pain. Negative for joint swelling and gait problem.  Skin: Negative for rash.  Neurological: Negative for weakness, numbness and headaches.    Allergies  Doxycycline; Hydromorphone hcl; Ibuprofen; Iohexol; Morphine sulfate; and Sulfamethoxazole w/trimethoprim  Home Medications   Current Outpatient Rx  Name Route Sig Dispense Refill  . BUPROPION HCL 100 MG PO TABS  Oral Take 150 mg by mouth daily.      Marland Kitchen HYDROCHLOROTHIAZIDE 25 MG PO TABS Oral Take 25 mg by mouth daily.      . TRAZODONE HCL 100 MG PO TABS Oral Take 100 mg by mouth at bedtime.      . CELECOXIB 200 MG PO CAPS Oral Take 1 capsule (200 mg total) by mouth 2 (two) times daily. 60 capsule 0  . CYCLOBENZAPRINE HCL 10 MG PO TABS Oral Take 1 tablet (10 mg total) by mouth 3 (three) times daily as needed. 30 tablet 0  . TRAMADOL HCL 50 MG PO TABS Oral Take 1 tablet (50 mg total) by mouth every 8 (eight) hours as needed for pain. Maximum dose= 8 tablets per day 15 tablet 0    BP 151/88  Pulse 72  Temp(Src) 98.7 F (37.1 C) (Oral)  Resp 16  SpO2 100%  Physical Exam  Nursing note and vitals reviewed. Constitutional: She is oriented to person, place, and time. She appears well-developed and well-nourished. No distress.  HENT:  Head: Normocephalic and atraumatic.  Neck: Normal range of motion. Neck supple. No tracheal deviation present. No thyromegaly present.  Cardiovascular: Normal heart sounds.   Pulmonary/Chest: Breath sounds normal.  Abdominal: Soft. She exhibits no distension. There is no tenderness.       Negative CVT  Musculoskeletal:       Right shoulder: She exhibits decreased range of motion, tenderness, pain and spasm. She exhibits no bony tenderness, no swelling,  no effusion, no crepitus, no deformity, no laceration, normal pulse and normal strength.       Lumbar back: She exhibits decreased range of motion, tenderness and spasm. She exhibits no bony tenderness, no swelling, no edema and no deformity.       Back:       Arms: Lymphadenopathy:    She has no cervical adenopathy.  Neurological: She is alert and oriented to person, place, and time. She has normal strength and normal reflexes. No sensory deficit.  Reflex Scores:      Tricep reflexes are 2+ on the right side and 2+ on the left side.      Bicep reflexes are 2+ on the right side and 2+ on the left side.      Patellar  reflexes are 2+ on the right side and 2+ on the left side.      Achilles reflexes are 2+ on the right side and 2+ on the left side. Skin: No rash noted.    ED Course  Procedures (including critical care time)  Labs Reviewed - No data to display No results found.   1. Rotator cuff (capsule) sprain and strain   2. Low back pain       MDM   Symptomatic treatment.       Sharin Grave, MD 10/09/11 [phone]

## 2011-11-28 ENCOUNTER — Other Ambulatory Visit (HOSPITAL_COMMUNITY): Payer: Self-pay | Admitting: Internal Medicine

## 2011-11-28 DIAGNOSIS — Z1231 Encounter for screening mammogram for malignant neoplasm of breast: Secondary | ICD-10-CM

## 2011-12-29 ENCOUNTER — Ambulatory Visit (HOSPITAL_COMMUNITY): Payer: Medicare Other | Attending: Internal Medicine

## 2012-04-05 ENCOUNTER — Emergency Department: Payer: Self-pay | Admitting: Internal Medicine

## 2012-12-10 ENCOUNTER — Ambulatory Visit (HOSPITAL_COMMUNITY): Payer: Medicare Other

## 2012-12-14 ENCOUNTER — Ambulatory Visit (HOSPITAL_COMMUNITY): Payer: Medicare Other | Attending: Internal Medicine

## 2013-01-14 ENCOUNTER — Ambulatory Visit (HOSPITAL_COMMUNITY): Payer: Medicare Other

## 2013-01-15 ENCOUNTER — Ambulatory Visit (HOSPITAL_COMMUNITY): Payer: Medicare Other | Attending: Internal Medicine

## 2013-08-28 ENCOUNTER — Other Ambulatory Visit (HOSPITAL_COMMUNITY): Payer: Self-pay | Admitting: Internal Medicine

## 2013-08-28 DIAGNOSIS — Z0000 Encounter for general adult medical examination without abnormal findings: Secondary | ICD-10-CM

## 2013-09-13 ENCOUNTER — Ambulatory Visit (HOSPITAL_COMMUNITY): Payer: Medicare Other | Attending: Internal Medicine

## 2013-12-11 ENCOUNTER — Other Ambulatory Visit: Payer: Self-pay | Admitting: Internal Medicine

## 2013-12-11 DIAGNOSIS — R634 Abnormal weight loss: Secondary | ICD-10-CM

## 2013-12-19 ENCOUNTER — Inpatient Hospital Stay: Admission: RE | Admit: 2013-12-19 | Payer: Medicare Other | Source: Ambulatory Visit

## 2014-01-27 ENCOUNTER — Other Ambulatory Visit (HOSPITAL_COMMUNITY): Payer: Self-pay | Admitting: Internal Medicine

## 2014-01-27 DIAGNOSIS — Z1231 Encounter for screening mammogram for malignant neoplasm of breast: Secondary | ICD-10-CM

## 2014-02-06 ENCOUNTER — Ambulatory Visit (HOSPITAL_COMMUNITY): Payer: Medicare Other

## 2014-02-11 ENCOUNTER — Ambulatory Visit (HOSPITAL_COMMUNITY)
Admission: RE | Admit: 2014-02-11 | Discharge: 2014-02-11 | Disposition: A | Discharge status: Home / Self Care | Payer: Medicare Other | Source: Ambulatory Visit | Attending: Internal Medicine | Admitting: Internal Medicine

## 2014-02-11 DIAGNOSIS — Z1231 Encounter for screening mammogram for malignant neoplasm of breast: Secondary | ICD-10-CM | POA: Insufficient documentation

## 2014-02-12 ENCOUNTER — Emergency Department: Payer: Self-pay | Admitting: Emergency Medicine

## 2014-02-12 LAB — CBC WITH DIFFERENTIAL/PLATELET
BASOS ABS: 0.1 10*3/uL (ref 0.0–0.1)
Basophil %: 0.8 %
Eosinophil #: 0.4 10*3/uL (ref 0.0–0.7)
Eosinophil %: 5.5 %
HCT: 42.8 % (ref 35.0–47.0)
HGB: 14.5 g/dL (ref 12.0–16.0)
LYMPHS PCT: 22.2 %
Lymphocyte #: 1.5 10*3/uL (ref 1.0–3.6)
MCH: 32.6 pg (ref 26.0–34.0)
MCHC: 33.7 g/dL (ref 32.0–36.0)
MCV: 97 fL (ref 80–100)
MONOS PCT: 9.6 %
Monocyte #: 0.6 x10 3/mm (ref 0.2–0.9)
Neutrophil #: 4.1 10*3/uL (ref 1.4–6.5)
Neutrophil %: 61.9 %
Platelet: 210 10*3/uL (ref 150–440)
RBC: 4.43 10*6/uL (ref 3.80–5.20)
RDW: 14 % (ref 11.5–14.5)
WBC: 6.7 10*3/uL (ref 3.6–11.0)

## 2014-02-12 LAB — URINALYSIS, COMPLETE
Bacteria: NONE SEEN
Bilirubin,UR: NEGATIVE
Glucose,UR: NEGATIVE mg/dL (ref 0–75)
KETONE: NEGATIVE
LEUKOCYTE ESTERASE: NEGATIVE
Nitrite: NEGATIVE
Ph: 7 (ref 4.5–8.0)
Protein: NEGATIVE
RBC,UR: NONE SEEN /HPF (ref 0–5)
SPECIFIC GRAVITY: 1.004 (ref 1.003–1.030)
Squamous Epithelial: <1
WBC UR: <1 /HPF (ref 0–5)

## 2014-02-12 LAB — COMPREHENSIVE METABOLIC PANEL
ALT: 34 U/L (ref 12–78)
AST: 43 U/L — AB (ref 15–37)
Albumin: 4 g/dL (ref 3.4–5.0)
Alkaline Phosphatase: 114 U/L
Anion Gap: 5 — ABNORMAL LOW (ref 7–16)
BUN: 12 mg/dL (ref 7–18)
Bilirubin,Total: 0.4 mg/dL (ref 0.2–1.0)
CALCIUM: 9.7 mg/dL (ref 8.5–10.1)
Chloride: 104 mmol/L (ref 98–107)
Co2: 30 mmol/L (ref 21–32)
Creatinine: 0.96 mg/dL (ref 0.60–1.30)
EGFR (African American): >60
EGFR (Non-African Amer.): >60
Glucose: 118 mg/dL — ABNORMAL HIGH (ref 65–99)
Osmolality: 278 (ref 275–301)
Potassium: 3.4 mmol/L — ABNORMAL LOW (ref 3.5–5.1)
Sodium: 139 mmol/L (ref 136–145)
Total Protein: 8.1 g/dL (ref 6.4–8.2)

## 2014-02-12 LAB — LIPASE, BLOOD: Lipase: 209 U/L (ref 73–393)

## 2014-03-22 ENCOUNTER — Emergency Department (HOSPITAL_COMMUNITY): Payer: Medicare Other

## 2014-03-22 ENCOUNTER — Encounter (HOSPITAL_COMMUNITY): Payer: Self-pay | Admitting: Emergency Medicine

## 2014-03-22 ENCOUNTER — Emergency Department (HOSPITAL_COMMUNITY)
Admission: EM | Admit: 2014-03-22 | Discharge: 2014-03-23 | Disposition: A | Discharge status: Home / Self Care | Payer: Medicare Other | Attending: Emergency Medicine | Admitting: Emergency Medicine

## 2014-03-22 DIAGNOSIS — Z79899 Other long term (current) drug therapy: Secondary | ICD-10-CM | POA: Insufficient documentation

## 2014-03-22 DIAGNOSIS — I10 Essential (primary) hypertension: Secondary | ICD-10-CM | POA: Insufficient documentation

## 2014-03-22 DIAGNOSIS — F3289 Other specified depressive episodes: Secondary | ICD-10-CM | POA: Insufficient documentation

## 2014-03-22 DIAGNOSIS — F329 Major depressive disorder, single episode, unspecified: Secondary | ICD-10-CM | POA: Insufficient documentation

## 2014-03-22 DIAGNOSIS — R55 Syncope and collapse: Secondary | ICD-10-CM

## 2014-03-22 LAB — COMPREHENSIVE METABOLIC PANEL
ALBUMIN: 4.4 g/dL (ref 3.5–5.2)
ALT: 16 U/L (ref 0–35)
AST: 19 U/L (ref 0–37)
Alkaline Phosphatase: 93 U/L (ref 39–117)
BUN: 22 mg/dL (ref 6–23)
CO2: 28 mEq/L (ref 19–32)
CREATININE: 1.32 mg/dL — AB (ref 0.50–1.10)
Calcium: 10.2 mg/dL (ref 8.4–10.5)
Chloride: 99 mEq/L (ref 96–112)
GFR calc Af Amer: 53 mL/min — ABNORMAL LOW (ref >90)
GFR calc non Af Amer: 46 mL/min — ABNORMAL LOW (ref >90)
Glucose, Bld: 106 mg/dL — ABNORMAL HIGH (ref 70–99)
Potassium: 4 mEq/L (ref 3.7–5.3)
Sodium: 140 mEq/L (ref 137–147)
TOTAL PROTEIN: 8.2 g/dL (ref 6.0–8.3)
Total Bilirubin: 0.2 mg/dL — ABNORMAL LOW (ref 0.3–1.2)

## 2014-03-22 LAB — CBC WITH DIFFERENTIAL/PLATELET
BASOS ABS: 0 10*3/uL (ref 0.0–0.1)
BASOS PCT: 0 % (ref 0–1)
EOS ABS: 0.3 10*3/uL (ref 0.0–0.7)
Eosinophils Relative: 5 % (ref 0–5)
HCT: 42.6 % (ref 36.0–46.0)
HEMOGLOBIN: 14.5 g/dL (ref 12.0–15.0)
Lymphocytes Relative: 24 % (ref 12–46)
Lymphs Abs: 1.3 10*3/uL (ref 0.7–4.0)
MCH: 31.9 pg (ref 26.0–34.0)
MCHC: 34 g/dL (ref 30.0–36.0)
MCV: 93.8 fL (ref 78.0–100.0)
MONO ABS: 0.7 10*3/uL (ref 0.1–1.0)
MONOS PCT: 13 % — AB (ref 3–12)
NEUTROS ABS: 3 10*3/uL (ref 1.7–7.7)
Neutrophils Relative %: 58 % (ref 43–77)
Platelets: 216 10*3/uL (ref 150–400)
RBC: 4.54 MIL/uL (ref 3.87–5.11)
RDW: 13.4 % (ref 11.5–15.5)
WBC: 5.2 10*3/uL (ref 4.0–10.5)

## 2014-03-22 LAB — I-STAT TROPONIN, ED: TROPONIN I, POC: 0 ng/mL (ref 0.00–0.08)

## 2014-03-22 LAB — CBG MONITORING, ED: Glucose-Capillary: 109 mg/dL — ABNORMAL HIGH (ref 70–99)

## 2014-03-22 MED ORDER — SODIUM CHLORIDE 0.9 % IV BOLUS (SEPSIS)
1000.0000 mL | Freq: Once | INTRAVENOUS | Status: AC
  Administered 2014-03-23: 1000 mL via INTRAVENOUS

## 2014-03-22 NOTE — ED Provider Notes (Signed)
CSN: [id number]     Arrival date & time 03/22/14  2159 History   First MD Initiated Contact with Patient 03/22/14 2306     Chief Complaint  Patient presents with  . Loss of Consciousness     (Consider location/radiation/quality/duration/timing/severity/associated sxs/prior Treatment) HPI Comments: Shannon Mayo is a 48y/o AAF with a PMHx of HTN, anxiety, depression, fibromyalgia, and migraines who presents to the ED today following a syncopal episode that occurred at 8:30-9pm today. States that she received some emotionally upsetting news and "blacked out" for <5 minutes while sitting on the couch. Her friend was with her and witnessed the episode, and reports that the pt did not make any abnormal or repetitive movements, although she noticed some facial drooping on the right side. Friend reports that she "came to" once but "blacked out" again, before finally "coming to" again. Pt reports that she remembers the episode, did not feel any abnormal sensations prior to the episode, and did not feel confused following the episode. Pt states that she felt her right arm was weak for less than an hour following the episode, but denies having continued weakness. Endorses fatigue today, and has not been hydrating well. Denies fevers/chills, HA, CP, SOB, palpitations, N/V/D/C, abd pain, rhinorrhea, sore throat, changes in her vision, nuchal rigidity, dysuria, hematuria, or blood in her stools. Denies paresthesias currently. No known aggravating or alleviating factors. +FHx of CVA (mother). Pt does state that she has "blacked out" before, but usually due to her chronic abd pain.  Patient is a 48 y.o. female presenting with syncope. The history is provided by the patient and a friend.  Loss of Consciousness Episode history:  Single Most recent episode:  Today Duration:  5 minutes Timing:  Constant Progression:  Resolved Chronicity:  New Context comment:  Emotional event Witnessed: yes   Relieved by:   Nothing Worsened by:  Nothing tried Ineffective treatments:  None tried Associated symptoms: focal weakness (right arm, for <1 hr) and malaise/fatigue (throughout the day today)   Associated symptoms: no chest pain, no confusion, no diaphoresis, no difficulty breathing, no dizziness, no fever, no focal sensory loss, no headaches, no nausea, no palpitations, no recent fall, no recent injury, no rectal bleeding, no seizures, no shortness of breath, no visual change and no vomiting     Past Medical History  Diagnosis Date  . Hypertension   . IBS (irritable bowel syndrome)   . Depression   . Fibromyalgia   . Acid reflux    History reviewed. No pertinent past surgical history. History reviewed. No pertinent family history. History  Substance Use Topics  . Smoking status: Never Smoker   . Smokeless tobacco: Not on file  . Alcohol Use: Yes   OB History   Grav Para Term Preterm Abortions TAB SAB Ect Mult Living                 Review of Systems  Constitutional: Positive for malaise/fatigue (throughout the day today). Negative for fever, chills, diaphoresis and activity change.  HENT: Negative for ear pain, hearing loss, rhinorrhea, sinus pressure and sore throat.   Eyes: Negative for pain and visual disturbance.  Respiratory: Negative for cough, shortness of breath and wheezing.   Cardiovascular: Positive for syncope. Negative for chest pain and palpitations.  Gastrointestinal: Negative for nausea, vomiting, abdominal pain, diarrhea, constipation and blood in stool.  Genitourinary: Negative for dysuria, urgency, hematuria, decreased urine volume and difficulty urinating.  Musculoskeletal: Negative for arthralgias, gait problem, myalgias, neck  pain and neck stiffness.  Skin: Negative for pallor and rash.  Neurological: Positive for focal weakness (right arm, for <1 hr), syncope, facial asymmetry (temporary facial droop during syncopal episode, per friend) and numbness (right arm, for  <1hr). Negative for dizziness, seizures and headaches.  Hematological: Does not bruise/bleed easily.  Psychiatric/Behavioral: Negative for confusion.  All other systems reviewed and are negative.     Allergies  Doxycycline; Hydromorphone hcl; Ibuprofen; Iohexol; Morphine sulfate; and Sulfamethoxazole-trimethoprim  Home Medications   Prior to Admission medications   Medication Sig Start Date End Date Taking? Authorizing Provider  buPROPion (WELLBUTRIN XL) 150 MG 24 hr tablet Take 150 mg by mouth daily.   Yes Historical Provider, MD  celecoxib (CELEBREX) 200 MG capsule Take 1 capsule (200 mg total) by mouth 2 (two) times daily. 10/06/11  Yes Adlih Moreno-Coll, MD  dexlansoprazole (DEXILANT) 60 MG capsule Take 60 mg by mouth daily.   Yes Historical Provider, MD  hydrochlorothiazide (HYDRODIURIL) 25 MG tablet Take 25 mg by mouth daily.     Yes Historical Provider, MD  LISINOPRIL PO Take 20 mg by mouth daily.   Yes Historical Provider, MD  oxyCODONE-acetaminophen (PERCOCET/ROXICET) 5-325 MG per tablet Take 1 tablet by mouth 2 (two) times daily as needed for severe pain (for pain).   Yes Historical Provider, MD  traZODone (DESYREL) 100 MG tablet Take 100 mg by mouth at bedtime.     Yes Historical Provider, MD   BP 112/68  Pulse 58  Temp(Src) 98.6 F (37 C) (Oral)  Resp 17  Wt 125 lb (56.7 kg)  SpO2 99%  LMP 06/28/2010 Physical Exam  Nursing note and vitals reviewed. Constitutional: She is oriented to person, place, and time. Vital signs are normal. She appears well-developed and well-nourished. No distress.  WDWN AAF in NAD, answering appropriately  HENT:  Head: Normocephalic and atraumatic.  Mouth/Throat: Uvula is midline, oropharynx is clear and moist and mucous membranes are normal. Mucous membranes are not dry.  Eyes: Conjunctivae and EOM are normal. Pupils are equal, round, and reactive to light. Right eye exhibits no discharge. Left eye exhibits no discharge.  Neck: Normal  range of motion. Neck supple. No rigidity.  No nuchal rigidity  Cardiovascular: Normal rate, regular rhythm, normal heart sounds and intact distal pulses.   Pulmonary/Chest: Effort normal and breath sounds normal. No respiratory distress. She has no wheezes.  Abdominal: Soft. Normal appearance and bowel sounds are normal. She exhibits no distension. There is no tenderness. There is no rebound and no guarding.  Musculoskeletal: Normal range of motion.  Neurological: She is alert and oriented to person, place, and time. She has normal strength and normal reflexes. She displays no atrophy, no tremor and normal reflexes. No cranial nerve deficit or sensory deficit. She exhibits normal muscle tone. She displays a negative Romberg sign. Coordination and gait normal. GCS eye subscore is 4. GCS verbal subscore is 5. GCS motor subscore is 6.  CN II-XI intact, no focal neuro deficits. Gait and coordination within normal limits. DTRs equal and intact b/l, strength equal b/l. GCS 15. Sensation grossly intact.   Skin: Skin is warm, dry and intact. No rash noted. She is not diaphoretic. No pallor.  Psychiatric: She has a normal mood and affect. Cognition and memory are normal.    ED Course  Procedures (including critical care time) Labs Review Labs Reviewed  CBC WITH DIFFERENTIAL - Abnormal; Notable for the following:    Monocytes Relative 13 (*)    All other  components within normal limits  COMPREHENSIVE METABOLIC PANEL - Abnormal; Notable for the following:    Glucose, Bld 106 (*)    Creatinine, Ser 1.32 (*)    Total Bilirubin 0.2 (*)    GFR calc non Af Amer 46 (*)    GFR calc Af Amer 53 (*)    All other components within normal limits  URINALYSIS, ROUTINE W REFLEX MICROSCOPIC - Abnormal; Notable for the following:    APPearance CLOUDY (*)    Leukocytes, UA TRACE (*)    All other components within normal limits  URINE MICROSCOPIC-ADD ON - Abnormal; Notable for the following:    Bacteria, UA FEW  (*)    Casts GRANULAR CAST (*)    All other components within normal limits  CBG MONITORING, ED - Abnormal; Notable for the following:    Glucose-Capillary 109 (*)    All other components within normal limits  URINE CULTURE  I-STAT TROPOININ, ED  I-STAT TROPOININ, ED    Imaging Review Ct Head Wo Contrast  03/23/2014   CLINICAL DATA:  Syncope, facial droop  EXAM: CT HEAD WITHOUT CONTRAST  TECHNIQUE: Contiguous axial images were obtained from the base of the skull through the vertex without intravenous contrast.  COMPARISON:  05/26/2008  FINDINGS: No evidence of parenchymal hemorrhage or extra-axial fluid collection. No mass lesion, mass effect, or midline shift.  No CT evidence of acute infarction.  Cerebral volume is within normal limits.  No ventriculomegaly.  The visualized paranasal sinuses are essentially clear. The mastoid air cells are unopacified.  No evidence of calvarial fracture.  IMPRESSION: Normal head CT.   Electronically Signed   By: Charline BillsSriyesh  Krishnan M.D.   On: 03/23/2014 00:01     EKG Interpretation   Date/Time:  Saturday March 22 2014 22:05:41 EDT Ventricular Rate:  70 PR Interval:  148 QRS Duration: 82 QT Interval:  396 QTC Calculation: 427 R Axis:   57 Text Interpretation:  Sinus rhythm Left atrial enlargement No significant  change since last tracing Confirmed by DOCHERTY  MD, MEGAN ([id number]) on  03/22/2014 10:17:57 PM      MDM   Final diagnoses:  Syncope, vasovagal    Shannon Mayo is a 48y/o AAF with a hx of HTN, anxiety, depression, fibromyalgia, and migraines who presents to the ED today following a syncopal episode that lasted less than 5 minutes occurred following emotional news, and was witnessed by friend, with no fall but some R arm weakness which subsided prior to exam. Neuro exam unremarkable. Will obtain labs, EKG, and head CT and reassess. Pt endorses poor hydration today, will fluid bolus now. DDx includes TIA/CVA, seizure, conversion disorder,  dysrrhythmia, and anemia. Low clinical suspicion for CVA or seizures given resolution of symptoms, and no seizure-like activity or post-ictal state described by patient and/or friend.  12:27 AM EKG unremarkable, unchanged from prior. CBC w/ diff unremarkable, no anemia noted. CMP shows slightly elevated Cr, likely related to poor PO intake. CBG WNL. Head CT unremarkable. I believe this is likely due to an emotional event given her unremarkable labs and normal neurological exam. Will ambulate patient and ensure pt is stable for d/c  1:21 AM Pt ambulated hall with no issues, denied CP, SOB, or feeling presyncopal. Pt given return precautions. Follow up with PCP on Monday (Dr. Butler DenmarkEdward Abower, per pt). Pt understands and agrees with plan, stable at time of D/C.  BP 112/68  Pulse 58  Temp(Src) 98.6 F (37 C) (Oral)  Resp 17  Wt 125 lb (56.7 kg)  SpO2 99%  LMP 06/28/2010     Donnita Falls Camprubi-Soms, PA-C 03/23/14 0132  Donnita Falls Camprubi-Soms, PA-C 03/23/14 [phone]

## 2014-03-22 NOTE — ED Notes (Addendum)
Patient transported to CT 

## 2014-03-22 NOTE — ED Notes (Signed)
Pt arrived to the Ed with a complaint of a syncopal episode.  Pt has hypertension.  Pt has had a stressful situation in her life which caused her to become anxious and have a syncopal episode.  Pt family states that she had facial droop post episode.  Pt states she has had trouble writing with her right hand up until recently in the hospital.  Pt is A&O x4.  Pt GWS is 15

## 2014-03-23 LAB — URINALYSIS, ROUTINE W REFLEX MICROSCOPIC
BILIRUBIN URINE: NEGATIVE
Glucose, UA: NEGATIVE mg/dL
Hgb urine dipstick: NEGATIVE
Ketones, ur: NEGATIVE mg/dL
Nitrite: NEGATIVE
Protein, ur: NEGATIVE mg/dL
SPECIFIC GRAVITY, URINE: 1.014 (ref 1.005–1.030)
UROBILINOGEN UA: 0.2 mg/dL (ref 0.0–1.0)
pH: 6 (ref 5.0–8.0)

## 2014-03-23 LAB — URINE MICROSCOPIC-ADD ON

## 2014-03-23 NOTE — Discharge Instructions (Signed)
Follow up with your primary care doctor on Monday. Stay well hydrated. Return if you develop weakness on one side of your body that does not go away, and facial asymmetry, neck pain/stiffness, or high fever. If any additional symptoms occur with another episode, please return to the emergency department.

## 2014-03-23 NOTE — ED Provider Notes (Signed)
Medical screening examination/treatment/procedure(s) were performed by non-physician practitioner and as supervising physician I was immediately available for consultation/collaboration.   EKG Interpretation   Date/Time:  Saturday March 22 2014 22:05:41 EDT Ventricular Rate:  70 PR Interval:  148 QRS Duration: 82 QT Interval:  396 QTC Calculation: 427 R Axis:   57 Text Interpretation:  Sinus rhythm Left atrial enlargement No significant  change since last tracing Confirmed by DOCHERTY  MD, MEGAN [phone]([id number]) on  03/22/2014 10:17:57 PM        Brandt LoosenJulie Manly, MD 03/23/14 2313

## 2014-03-25 LAB — URINE CULTURE: Colony Count: >=100000

## 2014-03-27 ENCOUNTER — Telehealth (HOSPITAL_BASED_OUTPATIENT_CLINIC_OR_DEPARTMENT_OTHER): Payer: Self-pay | Admitting: Emergency Medicine

## 2014-03-27 NOTE — Telephone Encounter (Signed)
Post ED Visit - Positive Culture Follow-up  Culture report reviewed by antimicrobial stewardship pharmacist: []  Wes Dulaney, Pharm.D., BCPS []  Celedonio MiyamotoJeremy Frens, Pharm.D., BCPS []  Georgina PillionElizabeth Martin, [address]Ph[address] BCPS []  [place] MillsMinh Pham, [address]Ph[address]D., BCPS, AAHIVP []  Estella HuskMichelle Turner, Pharm.D., BCPS, AAHIVP []  Harvie JuniorNathan Cope, Pharm.D. [x]  Joyice FasterJonathan Binz, Pharm.D.  Positive urine culture Per Junius FinnerErin O'Malley PA-C, no treatment necessary  and no further patient follow-up is required at this time.  [place], Jenel LucksKylie 03/27/2014, 6:03 PM

## 2014-07-16 ENCOUNTER — Emergency Department: Payer: Self-pay | Admitting: Emergency Medicine

## 2014-09-02 ENCOUNTER — Emergency Department: Payer: Self-pay | Admitting: Emergency Medicine

## 2014-12-10 ENCOUNTER — Emergency Department: Payer: Self-pay | Admitting: Emergency Medicine

## 2015-02-03 ENCOUNTER — Emergency Department
Admit: 2015-02-03 | Disposition: A | Discharge status: Home / Self Care | Payer: Self-pay | Admitting: Emergency Medicine

## 2015-02-03 LAB — BASIC METABOLIC PANEL
Anion Gap: 8 (ref 7–16)
BUN: 8 mg/dL
CALCIUM: 9.6 mg/dL
Chloride: 102 mmol/L
Co2: 31 mmol/L
Creatinine: 0.78 mg/dL
EGFR (Non-African Amer.): >60
Glucose: 100 mg/dL — ABNORMAL HIGH
POTASSIUM: 3.4 mmol/L — AB
SODIUM: 141 mmol/L

## 2015-02-03 LAB — CBC
HCT: 40.3 % (ref 35.0–47.0)
HGB: 13.3 g/dL (ref 12.0–16.0)
MCH: 31 pg (ref 26.0–34.0)
MCHC: 32.9 g/dL (ref 32.0–36.0)
MCV: 94 fL (ref 80–100)
PLATELETS: 183 10*3/uL (ref 150–440)
RBC: 4.28 10*6/uL (ref 3.80–5.20)
RDW: 13.5 % (ref 11.5–14.5)
WBC: 4.9 10*3/uL (ref 3.6–11.0)

## 2015-02-03 LAB — TROPONIN I
Troponin-I: <0.03 ng/mL
Troponin-I: <0.03 ng/mL

## 2015-02-03 LAB — D-DIMER(ARMC): D-Dimer: 300 ng/ml

## 2015-10-08 ENCOUNTER — Emergency Department
Admission: EM | Admit: 2015-10-08 | Discharge: 2015-10-08 | Disposition: A | Discharge status: Home / Self Care | Payer: Medicaid Other | Attending: Emergency Medicine | Admitting: Emergency Medicine

## 2015-10-08 ENCOUNTER — Emergency Department: Payer: Medicaid Other

## 2015-10-08 DIAGNOSIS — M79641 Pain in right hand: Secondary | ICD-10-CM | POA: Insufficient documentation

## 2015-10-08 DIAGNOSIS — Z79899 Other long term (current) drug therapy: Secondary | ICD-10-CM | POA: Insufficient documentation

## 2015-10-08 DIAGNOSIS — I10 Essential (primary) hypertension: Secondary | ICD-10-CM | POA: Diagnosis not present

## 2015-10-08 DIAGNOSIS — Z791 Long term (current) use of non-steroidal anti-inflammatories (NSAID): Secondary | ICD-10-CM | POA: Insufficient documentation

## 2015-10-08 NOTE — ED Notes (Signed)
Pt c/o right hand pain, states she started working a new job 2 weeks ago that has a lot of repetitive motion.

## 2015-10-08 NOTE — ED Provider Notes (Signed)
[facility] Emergency Department Provider Note  ____________________________________________  Time seen: Approximately 2:36 PM  I have reviewed the triage vital signs and the nursing notes.   HISTORY  Chief Complaint Hand Pain   HPI Shannon Mayo is a 49 y.o. female is here complaining of right hand pain for 2 weeks. Patient states it is painful to straighten out her fingers or make a fist.Patient states that she just started working on a new job 2 weeks ago and has a lot of repetitive motion at her job. She also reports that she saw a rheumatologist at [facility] several weeks ago who placed her on Celebrex 200 mg one twice a day but then she was told by her PCP to reduce down to once a day. She denies any recent injury to her right hand. She states that she has sharp radiating pain into her index finger and third finger from the middle of her palm.   Past Medical History  Diagnosis Date  . Hypertension   . IBS (irritable bowel syndrome)   . Depression   . Fibromyalgia   . Acid reflux     Patient Active Problem List   Diagnosis Date Noted  . PRURITUS 09/22/2008  . KNEE PAIN, BILATERAL 09/22/2008  . ROTATOR CUFF SYNDROME, LEFT 09/22/2008  . NONSPECIFIC ABNORMAL RESULTS LIVR FUNCTION STUDY 05/08/2008  . BOILS, RECURRENT 05/06/2008  . HYPERMETROPIA 03/10/2008  . NASAL POLYP 03/10/2008  . HYPERTENSION 11/16/2007  . PERIMENOPAUSAL STATUS 07/05/2007  . SHOULDER PAIN, RIGHT, CHRONIC 07/05/2007  . AMENORRHEA, SECONDARY 06/18/2007  . FIBROMYALGIA 03/19/2007  . METHICILLIN RESISTANT STAPHYLOCOCCUS AUREUS INFECTION 12/01/2006  . ANXIETY 12/01/2006  . DISORDER, DEPRESSIVE NEC 12/01/2006  . MIGRAINE HEADACHE 12/01/2006  . ALLERGIC RHINITIS, SEASONAL 12/01/2006  . GERD 12/01/2006  . GASTRITIS 12/01/2006  . IRRITABLE BOWEL SYNDROME 12/01/2006    Past Surgical History  Procedure Laterality Date  . Cholecystectomy    . Tubal ligation      Current  Outpatient Rx  Name  Route  Sig  Dispense  Refill  . buPROPion (WELLBUTRIN XL) 150 MG 24 hr tablet   Oral   Take 150 mg by mouth daily.         . celecoxib (CELEBREX) 200 MG capsule   Oral   Take 1 capsule (200 mg total) by mouth 2 (two) times daily.   60 capsule   0   . dexlansoprazole (DEXILANT) 60 MG capsule   Oral   Take 60 mg by mouth daily.         . hydrochlorothiazide (HYDRODIURIL) 25 MG tablet   Oral   Take 25 mg by mouth daily.           Marland Kitchen LISINOPRIL PO   Oral   Take 20 mg by mouth daily.         Marland Kitchen oxyCODONE-acetaminophen (PERCOCET/ROXICET) 5-325 MG per tablet   Oral   Take 1 tablet by mouth 2 (two) times daily as needed for severe pain (for pain).         . traZODone (DESYREL) 100 MG tablet   Oral   Take 100 mg by mouth at bedtime.             Allergies Doxycycline; Hydromorphone hcl; Ibuprofen; Iohexol; Morphine sulfate; and Sulfamethoxazole-trimethoprim  No family history on file.  Social History Social History  Substance Use Topics  . Smoking status: Never Smoker   . Smokeless tobacco: None  . Alcohol Use: Yes    Review  of Systems Constitutional: No fever/chills Eyes: No visual changes. ENT: No sore throat. Cardiovascular: Denies chest pain. Respiratory: Denies shortness of breath. Gastrointestinal:  No nausea, no vomiting.  Musculoskeletal: Negative for back pain. Positive right hand pain. Skin: Negative for rash. Neurological: Negative for headaches, focal weakness or numbness.  10-point ROS otherwise negative.  ____________________________________________   PHYSICAL EXAM:  VITAL SIGNS: ED Triage Vitals  Enc Vitals Group     BP 10/08/15 1329 175/105 mmHg     Pulse Rate 10/08/15 1329 64     Resp 10/08/15 1329 20     Temp 10/08/15 1329 98.1 F (36.7 C)     Temp Source 10/08/15 1329 Oral     SpO2 10/08/15 1329 99 %     Weight 10/08/15 1329 130 lb (58.968 kg)     Height 10/08/15 1329 5\' 4"  (1.626 m)     Head Cir --       Peak Flow --      Pain Score 10/08/15 1339 10     Pain Loc --      Pain Edu? --      Excl. in GC? --     Constitutional: Alert and oriented. Well appearing and in no acute distress. Eyes: Conjunctivae are normal. PERRL. EOMI. Head: Atraumatic. Nose: No congestion/rhinnorhea. Neck: No stridor.   Hematological/Lymphatic/Immunilogical: No cervical lymphadenopathy. Cardiovascular: Normal rate, regular rhythm. Grossly normal heart sounds.  Good peripheral circulation. Respiratory: Normal respiratory effort.  No retractions. Lungs CTAB. Gastrointestinal: Soft and nontender. No distention.  Musculoskeletal: Restricted range of motion with the right hand. Patient is unable to make a complete fist secondary to pain. No joints were swollen on today's exam. Neurologic:  Normal speech and language. No gross focal neurologic deficits are appreciated. No gait instability. Skin:  Skin is warm, dry and intact. No rash noted. No warmth, erythema, or abrasions were noted to the right hand. Psychiatric: Mood and affect are normal. Speech and behavior are normal.  ____________________________________________   LABS (all labs ordered are listed, but only abnormal results are displayed)  Labs Reviewed - No data to display RADIOLOGY  No acute fracture per radiologist her right hand. Widened scapholunate interval consistent  with torn ligament. ____________________________________________   PROCEDURES  Procedure(s) performed: None  Critical Care performed: No  ____________________________________________   INITIAL IMPRESSION / ASSESSMENT AND PLAN / ED COURSE  Pertinent labs & imaging results that were available during my care of the patient were reviewed by me and considered in my medical decision making (see chart for details).  Patient was placed in a cockup wrist splint and told to wear this until she is seen by the orthopedist. She is given the name of the orthopedist on call for  today and told call and make an appointment. Questionable torn scapholunate ligament ____________________________________________   FINAL CLINICAL IMPRESSION(S) / ED DIAGNOSES  Final diagnoses:  Right hand pain      Tommi RumpsRhonda L Lilliana Turner, PA-C 10/08/15 1536  Sharman CheekPhillip Stafford, MD 10/08/15 1550

## 2015-10-08 NOTE — Discharge Instructions (Signed)
Musculoskeletal Pain Musculoskeletal pain is muscle and boney aches and pains. These pains can occur in any part of the body. Your caregiver may treat you without knowing the cause of the pain. They may treat you if blood or urine tests, X-rays, and other tests were normal.  CAUSES There is often not a definite cause or reason for these pains. These pains may be caused by a type of germ (virus). The discomfort may also come from overuse. Overuse includes working out too hard when your body is not fit. Boney aches also come from weather changes. Bone is sensitive to atmospheric pressure changes. HOME CARE INSTRUCTIONS   Ask when your test results will be ready. Make sure you get your test results.  Only take over-the-counter or prescription medicines for pain, discomfort, or fever as directed by your caregiver. If you were given medications for your condition, do not drive, operate machinery or power tools, or sign legal documents for 24 hours. Do not drink alcohol. Do not take sleeping pills or other medications that may interfere with treatment.  Continue all activities unless the activities cause more pain. When the pain lessens, slowly resume normal activities. Gradually increase the intensity and duration of the activities or exercise.  During periods of severe pain, bed rest may be helpful. Lay or sit in any position that is comfortable.  Putting ice on the injured area.  Put ice in a bag.  Place a towel between your skin and the bag.  Leave the ice on for 15 to 20 minutes, 3 to 4 times a day.  Follow up with your caregiver for continued problems and no reason can be found for the pain. If the pain becomes worse or does not go away, it may be necessary to repeat tests or do additional testing. Your caregiver may need to look further for a possible cause. SEEK IMMEDIATE MEDICAL CARE IF:  You have pain that is getting worse and is not relieved by medications.  You develop chest pain  that is associated with shortness or breath, sweating, feeling sick to your stomach (nauseous), or throw up (vomit).  Your pain becomes localized to the abdomen.  You develop any new symptoms that seem different or that concern you. MAKE SURE YOU:   Understand these instructions.  Will watch your condition.  Will get help right away if you are not doing well or get worse.   This information is not intended to replace advice given to you by your health care provider. Make sure you discuss any questions you have with your health care provider.   Document Released: 09/26/2005 Document Revised: 12/19/2011 Document Reviewed: 05/31/2013 Elsevier Interactive Patient Education 2016 [employer]    Make an appointment with Dr. Rosita KeaMenz for further evaluation of your hand. Wear your  cock-up splint splint until seen at your appointment. Increase your Celebrex to twice a day as you were originally taking.

## 2015-10-08 NOTE — ED Notes (Signed)
Pt in w/ complaints of worsening right hand pain x 2 weeks; painful for pt to straighten fingers and make fist.  Pt also reports new swelling to right axilla since last night.

## 2016-08-22 ENCOUNTER — Ambulatory Visit: Payer: Medicare Other

## 2016-12-14 ENCOUNTER — Encounter: Payer: Self-pay | Admitting: Physical Therapy

## 2016-12-14 ENCOUNTER — Ambulatory Visit: Payer: Medicaid Other | Attending: Sports Medicine | Admitting: Physical Therapy

## 2016-12-14 DIAGNOSIS — M25562 Pain in left knee: Secondary | ICD-10-CM | POA: Diagnosis present

## 2016-12-14 DIAGNOSIS — M25561 Pain in right knee: Secondary | ICD-10-CM | POA: Insufficient documentation

## 2016-12-14 DIAGNOSIS — G8929 Other chronic pain: Secondary | ICD-10-CM | POA: Insufficient documentation

## 2016-12-14 DIAGNOSIS — M6281 Muscle weakness (generalized): Secondary | ICD-10-CM | POA: Insufficient documentation

## 2016-12-14 DIAGNOSIS — M25661 Stiffness of right knee, not elsewhere classified: Secondary | ICD-10-CM | POA: Diagnosis present

## 2016-12-14 NOTE — Therapy (Signed)
[facility] [address] [place], [place], [address] Phone: [phone]   Fax:  [phone]  Physical Therapy Evaluation  Patient Details  Name: Shannon MangoJacqueline D Snipes MRN: [id number] Date of Birth: 09/08/1966 Referring Provider: Sharion Doveracy Reece Ray MD  Encounter Date: 12/14/2016      PT End of Session - 12/14/16 1058    Visit Number 1   Number of Visits 1   Date for PT Re-Evaluation 12/15/16   PT Start Time 1015   PT Stop Time 1100   PT Time Calculation (min) 45 min   Activity Tolerance Patient tolerated treatment well;Patient limited by pain   Behavior During Therapy [facility] for tasks assessed/performed      Past Medical History:  Diagnosis Date  . Acid reflux   . Arthritis   . Depression   . Fibromyalgia   . Hypertension   . IBS (irritable bowel syndrome)     Past Surgical History:  Procedure Laterality Date  . CHOLECYSTECTOMY    . CYSTECTOMY Left 1996   on the neck   . TUBAL LIGATION      There were no vitals filed for this visit.       Subjective Assessment - 12/14/16 1023    Subjective pt is a 51 y.o F with CC of bil knee pain that started about1 year ago with fluctuating symptoms prior that occured with non-traumtic onset. pain stays in the with mild tingling that goes in to the shin. report of the knees wanting to buckle, denies pop/ clicking.    Limitations Standing;Walking   How long can you sit comfortably? 10 min   How long can you stand comfortably? 10-15 min   How long can you walk comfortably? 10-15 min   Diagnostic tests x-ray   Patient Stated Goals to decrease pain, be able to use the knees better, decrease limitatoin   Currently in Pain? Yes   Pain Score 7    Pain Location Knee   Pain Orientation Right;Left   Pain Descriptors / Indicators Aching;Throbbing   Pain Type Chronic pain   Pain Onset More than a month ago   Pain Frequency Constant   Aggravating Factors  deep knee bending / squating,  going up stairs, any activity   Pain Relieving Factors pain medication, heating pad, hot towel            [facility] Assessment - 12/14/16 1028      Assessment   Medical Diagnosis bil knee pain   Referring Provider Osa Craverracy Reece Ray MD   Onset Date/Surgical Date --  1 year   Hand Dominance Right   Next MD Visit --  April 2018   Prior Therapy no     Precautions   Precautions None     Restrictions   Weight Bearing Restrictions No     Balance Screen   Has the patient fallen in the past 6 months No   Has the patient had a decrease in activity level because of a fear of falling?  No   Is the patient reluctant to leave their home because of a fear of falling?  No     Home Environment   Living Environment Private residence   Living Arrangements Alone   Type of Home House   Home Access Level entry   Home Layout One level     Prior Function   Level of Independence Independent;Independent with basic ADLs   Vocation Part time employment  maid/ cleaning  Vocation Requirements lifting/ carrying moving, pushing/ squating     Cognition   Overall Cognitive Status Within Functional Limits for tasks assessed     Posture/Postural Control   Posture/Postural Control Postural limitations     ROM / Strength   AROM / PROM / Strength AROM;Strength;PROM     AROM   AROM Assessment Site Knee   Right/Left Knee Right;Left   Right Knee Extension -20  ERP   Right Knee Flexion 50   Left Knee Extension 0   Left Knee Flexion 109  ERP     PROM   PROM Assessment Site Knee   Right/Left Knee Right;Left   Right Knee Extension -14  ERP   Right Knee Flexion 60   Left Knee Extension 0   Left Knee Flexion 122  ERP     Strength   Strength Assessment Site Knee   Right/Left Knee Right;Left   Right Knee Flexion 3/5  due to pain    Right Knee Extension 3/5   Left Knee Flexion 4-/5   Left Knee Extension 3+/5     Palpation   Palpation comment tightness in the bil vastus lateralis lateral  apsect of the patella.  mild soreness at bil tibial tuberosty with R>L      Ambulation/Gait   Ambulation/Gait Yes   Gait Pattern Step-through pattern;Decreased stride length;Trendelenburg;Antalgic;Lateral hip instability;Trunk flexed;Narrow base of support;Decreased trunk rotation                           PT Education - 12/14/16 1057    Education provided Yes   Education Details evaluation findings, HEP wiht proper form/ rationale wiht progression. [facility] clinic handout   Person(s) Educated Patient   Methods Explanation;Verbal cues;Handout   Comprehension Verbalized understanding;Verbal cues required                    Plan - 12/14/16 1117    Clinical Impression Statement mrs. Snipes presents to OPPT as a moderate complexity evaluation based on involved PMHx, increasing pain and exam findings regarding CC of bil knee pain. she demonstrates limited AROM/PROM with R>L and limited strength due to pain. significant tightness noted in bil vastus lateralis with referred pain down into the patella. provided HEP with progress for pt and theraband for strenghening as well as [facility] clinic handout.    Rehab Potential Good   PT Frequency One time visit   PT Next Visit Plan one time medicaid visit   PT Home Exercise Plan See pt handout   Consulted and Agree with Plan of Care Patient      Patient will benefit from skilled therapeutic intervention in order to improve the following deficits and impairments:  Abnormal gait, Decreased endurance, Decreased activity tolerance, Increased fascial restricitons, Difficulty walking, Decreased range of motion, Pain, Improper body mechanics, Decreased strength, Postural dysfunction  Visit Diagnosis: Chronic pain of right knee  Chronic pain of left knee  Stiffness of right knee, not elsewhere classified  Muscle weakness (generalized)     Problem List Patient Active Problem List   Diagnosis Date Noted  . PRURITUS  09/22/2008  . KNEE PAIN, BILATERAL 09/22/2008  . ROTATOR CUFF SYNDROME, LEFT 09/22/2008  . NONSPECIFIC ABNORMAL RESULTS LIVR FUNCTION STUDY 05/08/2008  . BOILS, RECURRENT 05/06/2008  . HYPERMETROPIA 03/10/2008  . NASAL POLYP 03/10/2008  . HYPERTENSION 11/16/2007  . PERIMENOPAUSAL STATUS 07/05/2007  . SHOULDER PAIN, RIGHT, CHRONIC 07/05/2007  . AMENORRHEA, SECONDARY 06/18/2007  . FIBROMYALGIA 03/19/2007  .  METHICILLIN RESISTANT STAPHYLOCOCCUS AUREUS INFECTION 12/01/2006  . ANXIETY 12/01/2006  . DISORDER, DEPRESSIVE NEC 12/01/2006  . MIGRAINE HEADACHE 12/01/2006  . ALLERGIC RHINITIS, SEASONAL 12/01/2006  . GERD 12/01/2006  . GASTRITIS 12/01/2006  . IRRITABLE BOWEL SYNDROME 12/01/2006   Lulu Riding PT, DPT, LAT, ATC  12/14/16  11:33 AM      [facility] [address] [place], [place], [address] Phone: [phone]   Fax:  [phone]  Name: CARNETTA LOSADA MRN: [id number] Date of Birth: 10/31/1965

## 2017-02-08 ENCOUNTER — Encounter (HOSPITAL_COMMUNITY): Payer: Self-pay | Admitting: *Deleted

## 2017-02-08 ENCOUNTER — Emergency Department (HOSPITAL_COMMUNITY)
Admission: EM | Admit: 2017-02-08 | Discharge: 2017-02-08 | Disposition: A | Discharge status: Home / Self Care | Payer: Medicaid Other | Attending: Emergency Medicine | Admitting: Emergency Medicine

## 2017-02-08 DIAGNOSIS — I10 Essential (primary) hypertension: Secondary | ICD-10-CM | POA: Diagnosis not present

## 2017-02-08 DIAGNOSIS — G44209 Tension-type headache, unspecified, not intractable: Secondary | ICD-10-CM

## 2017-02-08 DIAGNOSIS — R51 Headache: Secondary | ICD-10-CM | POA: Diagnosis present

## 2017-02-08 MED ORDER — KETOROLAC TROMETHAMINE 30 MG/ML IJ SOLN
30.0000 mg | Freq: Once | INTRAMUSCULAR | Status: DC
Start: 2017-02-08 — End: 2017-02-08

## 2017-02-08 MED ORDER — ONDANSETRON HCL 4 MG PO TABS: 4.0000 mg | ORAL_TABLET | Freq: Once | ORAL | Status: DC

## 2017-02-08 NOTE — ED Triage Notes (Signed)
Pt complains of hypertension and right sided head pain and tingling. Pt had BP of 163/111 at her PCP today  Pt took 25 mg HCTZmedication this morning and has taken 3 subsequently. Pt took tylenol at 1400 w/ no relief.

## 2017-02-08 NOTE — ED Provider Notes (Signed)
Emergency Department Provider Note   I have reviewed the triage vital signs and the nursing notes.   HISTORY  Chief Complaint Hypertension and Headache   HPI Shannon Mayo is a 51 y.o. female with PMH of depression, fibromyalgia, HTN, and IBS the emergency department for evaluation of mild, frontal headache and elevated blood pressures. Patient states that before bed last night she had a mild, right temporal headache with radiation around to her forehead. She was able to get to sleep without difficulty but woke up in the night with continued headache and continued on into today. She denies any fever or chills. No neck stiffness. No vision changes. No numbness or weakness. No difficulty walking. She took her blood pressure and noticed it was elevated and subsequently took multiple doses of her HCTZ. She took a total of 4 tablets of her blood pressure medication. She noticed increasing urination but no other symptoms.   Past Medical History:  Diagnosis Date  . Acid reflux   . Arthritis   . Depression   . Fibromyalgia   . Hypertension   . IBS (irritable bowel syndrome)     Patient Active Problem List   Diagnosis Date Noted  . PRURITUS 09/22/2008  . KNEE PAIN, BILATERAL 09/22/2008  . ROTATOR CUFF SYNDROME, LEFT 09/22/2008  . NONSPECIFIC ABNORMAL RESULTS LIVR FUNCTION STUDY 05/08/2008  . BOILS, RECURRENT 05/06/2008  . HYPERMETROPIA 03/10/2008  . NASAL POLYP 03/10/2008  . HYPERTENSION 11/16/2007  . PERIMENOPAUSAL STATUS 07/05/2007  . SHOULDER PAIN, RIGHT, CHRONIC 07/05/2007  . AMENORRHEA, SECONDARY 06/18/2007  . FIBROMYALGIA 03/19/2007  . METHICILLIN RESISTANT STAPHYLOCOCCUS AUREUS INFECTION 12/01/2006  . ANXIETY 12/01/2006  . DISORDER, DEPRESSIVE NEC 12/01/2006  . MIGRAINE HEADACHE 12/01/2006  . ALLERGIC RHINITIS, SEASONAL 12/01/2006  . GERD 12/01/2006  . GASTRITIS 12/01/2006  . IRRITABLE BOWEL SYNDROME 12/01/2006    Past Surgical History:  Procedure Laterality  Date  . CHOLECYSTECTOMY    . CYSTECTOMY Left 1996   on the neck   . TUBAL LIGATION      Current Outpatient Rx  . Order #: [id number] Class: Historical Med  . Order #: [id number] Class: Print  . Order #: [id number] Class: Historical Med  . Order #: [id number] Class: Historical Med  . Order #: [id number] Class: Historical Med  . Order #: [id number] Class: Historical Med  . Order #: [id number] Class: Historical Med  . Order #: [id number] Class: Historical Med    Allergies Doxycycline; Hydromorphone hcl; Ibuprofen; Iohexol; Morphine sulfate; and Sulfamethoxazole-trimethoprim  No family history on file.  Social History Social History  Substance Use Topics  . Smoking status: Never Smoker  . Smokeless tobacco: Never Used  . Alcohol use Yes    Review of Systems  Constitutional: No fever/chills. Positive elevated BP at home.  Eyes: No visual changes. ENT: No sore throat. Cardiovascular: Denies chest pain. Respiratory: Denies shortness of breath. Gastrointestinal: No abdominal pain.  No nausea, no vomiting.  No diarrhea.  No constipation. Genitourinary: Negative for dysuria. Musculoskeletal: Negative for back pain. Skin: Negative for rash. Neurological: Negative for focal weakness or numbness. Positive mild, persistent HA.   10-point ROS otherwise negative.  ____________________________________________   PHYSICAL EXAM:  VITAL SIGNS: ED Triage Vitals  Enc Vitals Group     BP 02/08/17 1754 (!) 162/109     Pulse Rate 02/08/17 1754 81     Resp 02/08/17 1754 18     Temp 02/08/17 1754 98.1 F (36.7 C)     Temp Source 02/08/17 1754 Oral     SpO2 02/08/17  1754 100 %     Pain Score 02/08/17 1759 7   Constitutional: Alert and oriented. Well appearing and in no acute distress. Eyes: Conjunctivae are normal. PERRL. EOMI.  Head: Atraumatic. Nose: No congestion/rhinnorhea. Mouth/Throat: Mucous membranes are moist.   Neck: No stridor.  No meningeal signs.  Cardiovascular: Normal rate,  regular rhythm. Good peripheral circulation. Grossly normal heart sounds.   Respiratory: Normal respiratory effort.  No retractions. Lungs CTAB. Gastrointestinal: Soft and nontender. No distention.  Musculoskeletal: No lower extremity tenderness nor edema. No gross deformities of extremities. Neurologic:  Normal speech and language. No gross focal neurologic deficits are appreciated. Normal gait. No pronator drift. Normal finger-to-nose testing.  Skin:  Skin is warm, dry and intact. No rash noted. Psychiatric: Mood and affect are normal. Speech and behavior are normal.  ____________________________________________   LABS (all labs ordered are listed, but only abnormal results are displayed)  Labs Reviewed - No data to display ____________________________________________  RADIOLOGY  None ____________________________________________   PROCEDURES  Procedure(s) performed:   Procedures  None ____________________________________________   INITIAL IMPRESSION / ASSESSMENT AND PLAN / ED COURSE  Pertinent labs & imaging results that were available during my care of the patient were reviewed by me and considered in my medical decision making (see chart for details).  Patient resents emergency pertinent for evaluation of headache and elevated blood pressure at home. She took multiple HCTZ tablets in an attempt to acutely decrease her blood pressure. No focal neurological deficits. Headache is mild to moderate with radiation to the forehead. Suspect tension headache with unrelated high blood pressure. Plan for lab work given her multiple doses of HCTZ today and reassess after pain medication.  Called back to patient's room by nursing with patient saying that she is feeling better prior to medication administration. She would like to defer blood testing to her PCP who she will call this AM. No evidence on exam or by history to suggest HTN-emergency to life-threatening cause of HA. Advised  that she only take her HCTZ as instructed and do f/u with PCP for repeat lab work given the excessive HCTZ use today.   At this time, I do not feel there is any life-threatening condition present. I have reviewed and discussed all results (EKG, imaging, lab, urine as appropriate), exam findings with patient. I have reviewed nursing notes and appropriate previous records.  I feel the patient is safe to be discharged home without further emergent workup. Discussed usual and customary return precautions. Patient and family (if present) verbalize understanding and are comfortable with this plan.  Patient will follow-up with their primary care provider. If they do not have a primary care provider, information for follow-up has been provided to them. All questions have been answered.  ____________________________________________  FINAL CLINICAL IMPRESSION(S) / ED DIAGNOSES  Final diagnoses:  Tension headache  Essential hypertension     MEDICATIONS GIVEN DURING THIS VISIT:  None  NEW OUTPATIENT MEDICATIONS STARTED DURING THIS VISIT:  None   Note:  This document was prepared using Dragon voice recognition software and may include unintentional dictation errors.  Alona Bene, MD Emergency Medicine   Maia Plan, MD 02/09/17 1357

## 2017-02-08 NOTE — Discharge Instructions (Signed)

## 2017-02-08 NOTE — ED Notes (Addendum)
Pt refusing blood draws. States, "I feel better and want to go home. I'll go see my doctor tomorrow."

## 2018-04-02 ENCOUNTER — Emergency Department (HOSPITAL_COMMUNITY)
Admission: EM | Admit: 2018-04-02 | Discharge: 2018-04-02 | Disposition: A | Discharge status: Home / Self Care | Payer: No Typology Code available for payment source | Attending: Emergency Medicine | Admitting: Emergency Medicine

## 2018-04-02 ENCOUNTER — Encounter (HOSPITAL_COMMUNITY): Payer: Self-pay | Admitting: Emergency Medicine

## 2018-04-02 DIAGNOSIS — M62838 Other muscle spasm: Secondary | ICD-10-CM | POA: Diagnosis not present

## 2018-04-02 DIAGNOSIS — M542 Cervicalgia: Secondary | ICD-10-CM | POA: Diagnosis present

## 2018-04-02 DIAGNOSIS — I10 Essential (primary) hypertension: Secondary | ICD-10-CM | POA: Diagnosis not present

## 2018-04-02 DIAGNOSIS — Z79899 Other long term (current) drug therapy: Secondary | ICD-10-CM | POA: Insufficient documentation

## 2018-04-02 MED ORDER — NAPROXEN 500 MG PO TABS: 500.0000 mg | ORAL_TABLET | Freq: Two times a day (BID) | ORAL | 0 refills | Status: DC | PRN

## 2018-04-02 MED ORDER — CYCLOBENZAPRINE HCL 10 MG PO TABS: 10.0000 mg | ORAL_TABLET | Freq: Three times a day (TID) | ORAL | 0 refills | Status: DC | PRN

## 2018-04-02 NOTE — Discharge Instructions (Signed)
Take naprosyn as directed for inflammation and pain (take on a schedule 2x/day with food for the next 2-3 days, then as needed thereafter) with tylenol for breakthrough pain and flexeril for muscle relaxation. Do not drive or operate machinery with muscle relaxant use. Take over the counter zantac when you take the naprosyn; make sure you take it on a full stomach. If you still get an upset stomach with naprosyn, try using maalox or tums in addition to the zantac. Ice to areas of soreness for the next 24 hours and then may move to heat, no more than 20 minutes at a time every hour for each. Expect to be sore for the next few days and follow up with primary care physician for recheck of ongoing symptoms in the next 1-2 weeks. Return to ER for emergent changing or worsening of symptoms.
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
## 2018-04-02 NOTE — ED Triage Notes (Signed)
Called for room placement x1 with no answer. 

## 2020-03-30 ENCOUNTER — Other Ambulatory Visit: Payer: Self-pay

## 2020-03-30 ENCOUNTER — Ambulatory Visit (HOSPITAL_COMMUNITY)
Admission: EM | Admit: 2020-03-30 | Discharge: 2020-03-30 | Disposition: A | Discharge status: Home / Self Care | Payer: Medicare Other

## 2020-03-30 DIAGNOSIS — F331 Major depressive disorder, recurrent, moderate: Secondary | ICD-10-CM

## 2020-03-30 NOTE — ED Notes (Signed)
Patient discharged AMA per provider.

## 2020-03-30 NOTE — Discharge Instructions (Signed)
Follow up outpatient is what patient wants

## 2020-04-14 ENCOUNTER — Other Ambulatory Visit: Payer: Self-pay

## 2020-04-14 ENCOUNTER — Ambulatory Visit (INDEPENDENT_AMBULATORY_CARE_PROVIDER_SITE_OTHER): Payer: Medicare Other | Admitting: Clinical

## 2020-04-14 DIAGNOSIS — F331 Major depressive disorder, recurrent, moderate: Secondary | ICD-10-CM

## 2020-04-18 DIAGNOSIS — F331 Major depressive disorder, recurrent, moderate: Secondary | ICD-10-CM | POA: Insufficient documentation
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
## 2020-05-01 ENCOUNTER — Encounter (HOSPITAL_COMMUNITY): Payer: Self-pay | Admitting: Psychiatry

## 2020-05-01 ENCOUNTER — Other Ambulatory Visit: Payer: Self-pay

## 2020-05-01 ENCOUNTER — Telehealth (HOSPITAL_COMMUNITY): Payer: Medicare Other | Admitting: Psychiatry

## 2020-05-04 ENCOUNTER — Ambulatory Visit (HOSPITAL_COMMUNITY): Payer: Medicare Other | Admitting: Psychiatry

## 2020-05-06 ENCOUNTER — Ambulatory Visit (HOSPITAL_COMMUNITY): Payer: Medicaid Other | Admitting: Clinical

## 2020-08-14 ENCOUNTER — Telehealth (HOSPITAL_COMMUNITY): Payer: Medicare Other | Admitting: Psychiatry

## 2020-08-14 ENCOUNTER — Other Ambulatory Visit: Payer: Self-pay

## 2020-08-24 ENCOUNTER — Ambulatory Visit (HOSPITAL_COMMUNITY): Payer: Medicare Other | Admitting: Clinical

## 2020-08-31 ENCOUNTER — Other Ambulatory Visit: Payer: Self-pay

## 2020-08-31 ENCOUNTER — Emergency Department (HOSPITAL_COMMUNITY): Payer: Medicare Other

## 2020-08-31 ENCOUNTER — Emergency Department (HOSPITAL_COMMUNITY)
Admission: EM | Admit: 2020-08-31 | Discharge: 2020-08-31 | Disposition: A | Discharge status: Home / Self Care | Payer: Medicare Other | Attending: Emergency Medicine | Admitting: Emergency Medicine

## 2020-08-31 DIAGNOSIS — M6281 Muscle weakness (generalized): Secondary | ICD-10-CM | POA: Insufficient documentation

## 2020-08-31 DIAGNOSIS — Z79899 Other long term (current) drug therapy: Secondary | ICD-10-CM | POA: Insufficient documentation

## 2020-08-31 DIAGNOSIS — I10 Essential (primary) hypertension: Secondary | ICD-10-CM | POA: Insufficient documentation

## 2020-08-31 DIAGNOSIS — R202 Paresthesia of skin: Secondary | ICD-10-CM | POA: Insufficient documentation

## 2020-08-31 LAB — CBC WITH DIFFERENTIAL/PLATELET
Abs Immature Granulocytes: 0 10*3/uL (ref 0.00–0.07)
Basophils Absolute: 0 10*3/uL (ref 0.0–0.1)
Basophils Relative: 1 %
Eosinophils Absolute: 0.2 10*3/uL (ref 0.0–0.5)
Eosinophils Relative: 5 %
HCT: 43.7 % (ref 36.0–46.0)
Hemoglobin: 14.3 g/dL (ref 12.0–15.0)
Immature Granulocytes: 0 %
Lymphocytes Relative: 39 %
Lymphs Abs: 1.5 10*3/uL (ref 0.7–4.0)
MCH: 31.4 pg (ref 26.0–34.0)
MCHC: 32.7 g/dL (ref 30.0–36.0)
MCV: 95.8 fL (ref 80.0–100.0)
Monocytes Absolute: 0.5 10*3/uL (ref 0.1–1.0)
Monocytes Relative: 12 %
Neutro Abs: 1.6 10*3/uL — ABNORMAL LOW (ref 1.7–7.7)
Neutrophils Relative %: 43 %
Platelets: 240 10*3/uL (ref 150–400)
RBC: 4.56 MIL/uL (ref 3.87–5.11)
RDW: 13 % (ref 11.5–15.5)
WBC: 3.7 10*3/uL — ABNORMAL LOW (ref 4.0–10.5)
nRBC: 0 % (ref 0.0–0.2)

## 2020-08-31 LAB — BASIC METABOLIC PANEL
Anion gap: 10 (ref 5–15)
BUN: 9 mg/dL (ref 6–20)
CO2: 27 mmol/L (ref 22–32)
Calcium: 9.5 mg/dL (ref 8.9–10.3)
Chloride: 103 mmol/L (ref 98–111)
Creatinine, Ser: 0.82 mg/dL (ref 0.44–1.00)
GFR, Estimated: >60 mL/min (ref >60)
Glucose, Bld: 102 mg/dL — ABNORMAL HIGH (ref 70–99)
Potassium: 3.5 mmol/L (ref 3.5–5.1)
Sodium: 140 mmol/L (ref 135–145)

## 2020-08-31 LAB — URINALYSIS, ROUTINE W REFLEX MICROSCOPIC
Bilirubin Urine: NEGATIVE
Glucose, UA: NEGATIVE mg/dL
Hgb urine dipstick: NEGATIVE
Ketones, ur: NEGATIVE mg/dL
Leukocytes,Ua: NEGATIVE
Nitrite: NEGATIVE
Protein, ur: NEGATIVE mg/dL
Specific Gravity, Urine: 1.003 — ABNORMAL LOW (ref 1.005–1.030)
pH: 7 (ref 5.0–8.0)

## 2020-08-31 LAB — HEPATIC FUNCTION PANEL
ALT: 21 U/L (ref 0–44)
AST: 26 U/L (ref 15–41)
Albumin: 4.6 g/dL (ref 3.5–5.0)
Alkaline Phosphatase: 85 U/L (ref 38–126)
Bilirubin, Direct: 0.1 mg/dL (ref 0.0–0.2)
Indirect Bilirubin: 0.6 mg/dL (ref 0.3–0.9)
Total Bilirubin: 0.7 mg/dL (ref 0.3–1.2)
Total Protein: 8.4 g/dL — ABNORMAL HIGH (ref 6.5–8.1)

## 2020-08-31 MED ORDER — GABAPENTIN 100 MG PO CAPS: 100.0000 mg | ORAL_CAPSULE | Freq: Three times a day (TID) | ORAL | 0 refills | Status: DC | PRN

## 2020-08-31 MED ORDER — LORAZEPAM 2 MG/ML IJ SOLN
1.0000 mg | Freq: Once | INTRAMUSCULAR | Status: DC
  Filled 2020-08-31: qty 1

## 2020-08-31 NOTE — ED Triage Notes (Signed)
Right arm weakness began on Saturday, 08/29/20. Pt reports SOB upon the onset of weakness. Pt reports feeling lightheaded, whole right side feels "tingly and just hurting". Pain seems to begin in her neck and radiate down pt's right leg. Pt reports being extremely stressed at the onset of weakness

## 2020-08-31 NOTE — ED Provider Notes (Signed)
[facility]   CSN: [id number] Arrival date & time: 08/31/20  1211     History Chief Complaint  Patient presents with  . Extremity Weakness    Shannon Mayo is a 54 y.o. female.  Patient states right-sided numbness and right lower leg weakness for the last 2 days.  Happened all of a sudden.  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  Denies any severe neck pain or pelvic pain or chest pain or abdominal pain.  She feels as if something is tingling in her arms and legs.  Denies any facial droop or speech changes or visual changes.  The history is provided by the patient.  Extremity Weakness This is a new problem. The current episode started 2 days ago. Pertinent negatives include no chest pain, no abdominal pain, no headaches and no shortness of breath. Nothing aggravates the symptoms. Nothing relieves the symptoms. She has tried nothing for the symptoms. The treatment provided no relief.       Past Medical History:  Diagnosis Date  . Acid reflux   . Arthritis   . Depression   . Fibromyalgia   . Hypertension   . IBS (irritable bowel syndrome)     Patient Active Problem List   Diagnosis Date Noted  . Major depressive disorder, recurrent episode, moderate with anxious distress (HCC) 04/18/2020  . PRURITUS 09/22/2008  . KNEE PAIN, BILATERAL 09/22/2008  . ROTATOR CUFF SYNDROME, LEFT 09/22/2008  . NONSPECIFIC ABNORMAL RESULTS LIVR FUNCTION STUDY 05/08/2008  . BOILS, RECURRENT 05/06/2008  . HYPERMETROPIA 03/10/2008  . NASAL POLYP 03/10/2008  . HYPERTENSION 11/16/2007  . PERIMENOPAUSAL STATUS 07/05/2007  . SHOULDER PAIN, RIGHT, CHRONIC 07/05/2007  . AMENORRHEA, SECONDARY 06/18/2007  . FIBROMYALGIA 03/19/2007  . METHICILLIN RESISTANT STAPHYLOCOCCUS AUREUS INFECTION 12/01/2006  . ANXIETY 12/01/2006  . DISORDER, DEPRESSIVE NEC 12/01/2006  . MIGRAINE HEADACHE 12/01/2006  . ALLERGIC RHINITIS, SEASONAL 12/01/2006    . GERD 12/01/2006  . GASTRITIS 12/01/2006  . IRRITABLE BOWEL SYNDROME 12/01/2006    Past Surgical History:  Procedure Laterality Date  . CHOLECYSTECTOMY    . CYSTECTOMY Left 1996   on the neck   . TUBAL LIGATION       OB History   No obstetric history on file.     No family history on file.  Social History   Tobacco Use  . Smoking status: Never Smoker  . Smokeless tobacco: Never Used  Substance Use Topics  . Alcohol use: Yes  . Drug use: No    Home Medications Prior to Admission medications   Medication Sig Start Date End Date Taking? Authorizing Provider  buPROPion (WELLBUTRIN XL) 150 MG 24 hr tablet Take 150 mg by mouth daily.   Yes [provider]  cyclobenzaprine (FLEXERIL) 10 MG tablet Take 1 tablet (10 mg total) by mouth 3 (three) times daily as needed for muscle spasms. 04/02/18  Yes Street, [place], PA-C  dexlansoprazole (DEXILANT) 60 MG capsule Take 60 mg by mouth daily.   Yes [provider]  diclofenac Sodium (VOLTAREN) 1 % GEL Apply 2 g topically 4 (four) times daily.   Yes [provider]  hydrochlorothiazide (HYDRODIURIL) 25 MG tablet Take 25 mg by mouth daily.     Yes [provider]  hydrOXYzine (ATARAX/VISTARIL) 50 MG tablet Take 50 mg by mouth 2 (two) times daily as needed for anxiety or itching.  01/25/17  Yes [provider]  meloxicam (MOBIC) 15 MG tablet Take 15  mg by mouth daily.   Yes [provider]  Multiple Vitamins-Minerals (HAIR SKIN AND NAILS FORMULA PO) Take 1 tablet by mouth daily.   Yes [provider]  promethazine (PHENERGAN) 12.5 MG tablet Take 12.5 mg by mouth every 6 (six) hours as needed for nausea or vomiting.   Yes [provider]  traZODone (DESYREL) 100 MG tablet Take 100 mg by mouth at bedtime.     Yes [provider]  triamcinolone ointment (KENALOG) 0.1 % Apply 1 application topically daily. 02/06/17  Yes [provider]  celecoxib  (CELEBREX) 200 MG capsule Take 1 capsule (200 mg total) by mouth 2 (two) times daily. Patient not taking: Reported on 08/31/2020 10/06/11   Moreno-Coll, Adlih, MD  naproxen (NAPROSYN) 500 MG tablet Take 1 tablet (500 mg total) by mouth 2 (two) times daily as needed for mild pain, moderate pain or headache (TAKE WITH MEALS.). Patient not taking: Reported on 08/31/2020 04/02/18   Street, [place], [place]    Allergies    Doxycycline, Hydromorphone hcl, Ibuprofen, Iohexol, Morphine sulfate, Sulfamethoxazole-trimethoprim, and Contrast media [iodinated diagnostic agents]  Review of Systems   Review of Systems  Constitutional: Negative for chills and fever.  HENT: Negative for ear pain and sore throat.   Eyes: Negative for pain and visual disturbance.  Respiratory: Negative for cough and shortness of breath.   Cardiovascular: Negative for chest pain and palpitations.  Gastrointestinal: Negative for abdominal pain and vomiting.  Genitourinary: Negative for dysuria and hematuria.  Musculoskeletal: Positive for extremity weakness. Negative for arthralgias and back pain.  Skin: Negative for color change and rash.  Neurological: Positive for weakness and numbness. Negative for dizziness, tremors, seizures, syncope, facial asymmetry, speech difficulty, light-headedness and headaches.  All other systems reviewed and are negative.   Physical Exam Updated Vital Signs  ED Triage Vitals  Enc Vitals Group     BP 08/31/20 1230 (!) 230/115     Pulse Rate 08/31/20 1230 70     Resp 08/31/20 1230 14     Temp 08/31/20 1233 98.7 F (37.1 C)     Temp Source 08/31/20 1233 Oral     SpO2 08/31/20 1230 99 %     Weight 08/31/20 1235 130 lb 1.1 oz (59 kg)     Height 08/31/20 1235 5\' 4"  (1.626 m)     Head Circumference --      Peak Flow --      Pain Score 08/31/20 1234 7     Pain Loc --      Pain Edu? --      Excl. in GC? --     Physical Exam Vitals and nursing note reviewed.  Constitutional:       General: She is not in acute distress.    Appearance: She is well-developed. She is not ill-appearing.  HENT:     Head: Normocephalic and atraumatic.     Nose: Nose normal.     Mouth/Throat:     Mouth: Mucous membranes are moist.  Eyes:     Extraocular Movements: Extraocular movements intact.     Conjunctiva/sclera: Conjunctivae normal.     Pupils: Pupils are equal, round, and reactive to light.  Neck:     Comments: No midline spinal tenderness Cardiovascular:     Rate and Rhythm: Normal rate and regular rhythm.     Pulses: Normal pulses.     Heart sounds: Normal heart sounds. No murmur heard.   Pulmonary:     Effort: Pulmonary effort  is normal. No respiratory distress.     Breath sounds: Normal breath sounds.  Abdominal:     General: Abdomen is flat.     Palpations: Abdomen is soft.     Tenderness: There is no abdominal tenderness.  Musculoskeletal:        General: No tenderness. Normal range of motion.     Cervical back: Normal range of motion and neck supple. No tenderness.  Skin:    General: Skin is warm and dry.     Capillary Refill: Capillary refill takes less than 2 seconds.  Neurological:     General: No focal deficit present.     Mental Status: She is alert and oriented to person, place, and time.     Cranial Nerves: No cranial nerve deficit.     Coordination: Coordination normal.     Gait: Gait normal.     Comments: Possibly some weakness in the right lower extremity but appears effort dependent, appears at 5+ out of 5 strength throughout otherwise, normal sensation throughout, normal speech, normal finger-to-nose finger, normal visual fields  Psychiatric:        Mood and Affect: Mood normal.     ED Results / Procedures / Treatments   Labs (all labs ordered are listed, but only abnormal results are displayed) Labs Reviewed  CBC WITH DIFFERENTIAL/PLATELET - Abnormal; Notable for the following components:      Result Value   WBC 3.7 (*)    Neutro Abs 1.6 (*)     All other components within normal limits  BASIC METABOLIC PANEL - Abnormal; Notable for the following components:   Glucose, Bld 102 (*)    All other components within normal limits  URINALYSIS, ROUTINE W REFLEX MICROSCOPIC - Abnormal; Notable for the following components:   Color, Urine STRAW (*)    Specific Gravity, Urine 1.003 (*)    All other components within normal limits  HEPATIC FUNCTION PANEL - Abnormal; Notable for the following components:   Total Protein 8.4 (*)    All other components within normal limits    EKG None  Radiology CT Head Wo Contrast  Result Date: 08/31/2020 CLINICAL DATA:  Right-sided body tingling. Weakness. History of stroke. EXAM: CT HEAD WITHOUT CONTRAST TECHNIQUE: Contiguous axial images were obtained from the base of the skull through the vertex without intravenous contrast. COMPARISON:  03/22/2014 FINDINGS: Brain: There is no evidence of an acute infarct, intracranial hemorrhage, mass, midline shift, or extra-axial fluid collection. The ventricles and sulci are normal. Minimal asymmetric calcification is again noted in the right globus pallidus. Vascular: No hyperdense vessel. Skull: No fracture or suspicious osseous lesion. Sinuses/Orbits: Partially visualized mucous retention cyst or polyp in the right maxillary sinus. Clear mastoid air cells. Unremarkable orbits. Other: None. IMPRESSION: No evidence of acute intracranial abnormality. Electronically Signed   By: Sebastian Ache M.D.   On: 08/31/2020 13:26    Procedures Procedures (including critical care time)  Medications Ordered in ED Medications  LORazepam (ATIVAN) injection 1 mg (0 mg Intravenous Hold 08/31/20 1522)    ED Course  I have reviewed the triage vital signs and the nursing notes.  Pertinent labs & imaging results that were available during my care of the patient were reviewed by me and considered in my medical decision making (see chart for details).    MDM  Rules/Calculators/A&P                          Shannon Jolly  Mayo is a 54 year old female with history of depression, IBS, fibromyalgia who presents the ED with right-sided numbness.  Patient hypertensive but otherwise normal vitals.  For the last 40 to 48 hours patient has had paresthesias down the right side of her body.  She states that it happened suddenly.  Neurologically she appears intact with normal sensation on the right side.  It appears that she might have some effort dependent weakness in the right lower extremity on exam.  Although multiple rechecks seems that she does have normal strength.  Overall she has no facial droop and cranial nerves appear intact.  She denies any visual problems or facial weakness or slurred speech.  Possibly could be a functional process but will get a CT scan of her head and anticipate getting an MRI of her brain.  She is not having any specific neck pain or hip pain.  Will rule out stroke and check basic labs.  Could be functional as well.  She does state that she has been under a lot of stress.  Patient handed off to oncoming ED staff with patient pending MRI.  If MRI is normal believe patient can follow-up outpatient with primary care doctor.  This chart was dictated using voice recognition software.  Despite best efforts to proofread,  errors can occur which can change the documentation meaning.   Final Clinical Impression(s) / ED Diagnoses Final diagnoses:  Paresthesia    Rx / DC Orders ED Discharge Orders    None       Virgina Norfolk, DO 08/31/20 1533

## 2020-08-31 NOTE — ED Notes (Signed)
Pt to MRI

## 2020-08-31 NOTE — ED Provider Notes (Signed)
°  Provider Note MRN:  [id number]  Arrival date & time: 08/31/20    ED Course and Medical Decision Making  Assumed care from Dr. Lockie Mola at shift change.  Patient well-appearing on my examination, no objective neurological deficits.  Denies any weakness at this time, still endorsing paresthesias to the right arm and right leg.  MRI is without evidence of acute process.  Radiology commenting on the possibility of MS, however this is unlikely.  She is appropriate for discharge with neurology follow-up.  Procedures  Final Clinical Impressions(s) / ED Diagnoses     ICD-10-CM   1. Paresthesia  R20.2     ED Discharge Orders         Ordered    Ambulatory referral to Neurology       Comments: An appointment is requested in approximately: 2 weeks   08/31/20 1545    gabapentin (NEURONTIN) 100 MG capsule  3 times daily PRN        08/31/20 1546            Discharge Instructions     You were evaluated in the Emergency Department and after careful evaluation, we did not find any emergent condition requiring admission or further testing in the hospital.  Your exam/testing today was overall reassuring.  Your testing today was reassuring and did not show any signs of a stroke.  You can try the gabapentin medication provided for symptom control.  We recommend follow-up with the neurologist for continued management and/or further testing.  Please return to the Emergency Department if you experience any worsening of your condition.  Thank you for allowing [place] to be a part of your care.     Elmer Sow. Pilar Plate, MD [facility] [email or web address]    Sabas Sous, MD 08/31/20 [phone]

## 2020-08-31 NOTE — Discharge Instructions (Addendum)
You were evaluated in the Emergency Department and after careful evaluation, we did not find any emergent condition requiring admission or further testing in the hospital.  Your exam/testing today was overall reassuring.  Your testing today was reassuring and did not show any signs of a stroke.  You can try the gabapentin medication provided for symptom control.  We recommend follow-up with the neurologist for continued management and/or further testing.  Please return to the Emergency Department if you experience any worsening of your condition.  Thank you for allowing [place] to be a part of your care.

## 2020-11-19 ENCOUNTER — Ambulatory Visit: Payer: No Typology Code available for payment source | Admitting: Neurology

## 2020-11-23 ENCOUNTER — Encounter: Payer: Self-pay | Admitting: Neurology

## 2020-12-15 ENCOUNTER — Other Ambulatory Visit: Payer: Self-pay

## 2020-12-15 ENCOUNTER — Encounter (HOSPITAL_COMMUNITY): Payer: Self-pay

## 2020-12-15 ENCOUNTER — Emergency Department (HOSPITAL_COMMUNITY)
Admission: EM | Admit: 2020-12-15 | Discharge: 2020-12-15 | Disposition: A | Discharge status: Home / Self Care | Payer: Medicare Other | Attending: Emergency Medicine | Admitting: Emergency Medicine

## 2020-12-15 ENCOUNTER — Ambulatory Visit (HOSPITAL_COMMUNITY)
Admission: EM | Admit: 2020-12-15 | Discharge: 2020-12-15 | Disposition: A | Discharge status: Home / Self Care | Payer: Medicare Other | Attending: Family Medicine | Admitting: Family Medicine

## 2020-12-15 DIAGNOSIS — B029 Zoster without complications: Secondary | ICD-10-CM

## 2020-12-15 DIAGNOSIS — Z5321 Procedure and treatment not carried out due to patient leaving prior to being seen by health care provider: Secondary | ICD-10-CM | POA: Diagnosis not present

## 2020-12-15 DIAGNOSIS — R21 Rash and other nonspecific skin eruption: Secondary | ICD-10-CM | POA: Insufficient documentation

## 2020-12-15 MED ORDER — CLOBETASOL PROPIONATE 0.05 % EX OINT: 1.0000 "application " | TOPICAL_OINTMENT | Freq: Two times a day (BID) | CUTANEOUS | 0 refills | Status: DC

## 2020-12-15 MED ORDER — KETOROLAC TROMETHAMINE 60 MG/2ML IM SOLN
60.0000 mg | Freq: Once | INTRAMUSCULAR | Status: AC
  Administered 2020-12-15: 60 mg via INTRAMUSCULAR

## 2020-12-15 MED ORDER — VALACYCLOVIR HCL 1 G PO TABS: 1000.0000 mg | ORAL_TABLET | Freq: Three times a day (TID) | ORAL | 0 refills | Status: AC

## 2020-12-15 MED ORDER — PREDNISONE 20 MG PO TABS: 40.0000 mg | ORAL_TABLET | Freq: Every day | ORAL | 0 refills | Status: DC

## 2020-12-15 MED ORDER — KETOROLAC TROMETHAMINE 60 MG/2ML IM SOLN
INTRAMUSCULAR | Status: AC
  Filled 2020-12-15: qty 2

## 2020-12-15 NOTE — ED Triage Notes (Signed)
Pt left before being seen at [facility] due to long wait. Pt has a painful rash under the back of her bra and located lt mid ABD. This all started yesterday. Pt reports having shingles years ago.

## 2020-12-15 NOTE — ED Triage Notes (Signed)
Pt has very painful rash on thoracic portion of back beginning today. Sts hx of shingles.

## 2020-12-15 NOTE — ED Provider Notes (Signed)
[facility]    CSN: [id number] Arrival date & time: 12/15/20  1143      History   Chief Complaint Chief Complaint  Patient presents with  . Back Pain    HPI Shannon Mayo is a 55 y.o. female.   Presenting today with 1 day history of left mid back burning pain that started this morning wrapping around lateral side and under left breast.  States she felt some bumps when she fell back there and now sees a rash forming on the left side of her abdomen under breast.  Denies any injury to the area, new products or medications, fever, chills but states pain is severe and she cannot even tolerate having a shirt or bra on very well.  Has been trying over-the-counter pain relievers without much relief.  History of shingles rash several years ago.     Past Medical History:  Diagnosis Date  . Acid reflux   . Arthritis   . Depression   . Fibromyalgia   . Hypertension   . IBS (irritable bowel syndrome)     Patient Active Problem List   Diagnosis Date Noted  . Major depressive disorder, recurrent episode, moderate with anxious distress (HCC) 04/18/2020  . PRURITUS 09/22/2008  . KNEE PAIN, BILATERAL 09/22/2008  . ROTATOR CUFF SYNDROME, LEFT 09/22/2008  . NONSPECIFIC ABNORMAL RESULTS LIVR FUNCTION STUDY 05/08/2008  . BOILS, RECURRENT 05/06/2008  . HYPERMETROPIA 03/10/2008  . NASAL POLYP 03/10/2008  . HYPERTENSION 11/16/2007  . PERIMENOPAUSAL STATUS 07/05/2007  . SHOULDER PAIN, RIGHT, CHRONIC 07/05/2007  . AMENORRHEA, SECONDARY 06/18/2007  . FIBROMYALGIA 03/19/2007  . METHICILLIN RESISTANT STAPHYLOCOCCUS AUREUS INFECTION 12/01/2006  . ANXIETY 12/01/2006  . DISORDER, DEPRESSIVE NEC 12/01/2006  . MIGRAINE HEADACHE 12/01/2006  . ALLERGIC RHINITIS, SEASONAL 12/01/2006  . GERD 12/01/2006  . GASTRITIS 12/01/2006  . IRRITABLE BOWEL SYNDROME 12/01/2006    Past Surgical History:  Procedure Laterality Date  . CHOLECYSTECTOMY    . CYSTECTOMY Left 1996   on the  neck   . TUBAL LIGATION      OB History   No obstetric history on file.      Home Medications    Prior to Admission medications   Medication Sig Start Date End Date Taking? Authorizing Provider  clobetasol ointment (TEMOVATE) 0.05 % Apply 1 application topically 2 (two) times daily. 12/15/20  Yes Particia Nearing, PA-C  predniSONE (DELTASONE) 20 MG tablet Take 2 tablets (40 mg total) by mouth daily with breakfast. 12/15/20  Yes Particia Nearing, PA-C  valACYclovir (VALTREX) 1000 MG tablet Take 1 tablet (1,000 mg total) by mouth 3 (three) times daily for 7 days. 12/15/20 12/22/20 Yes Particia Nearing, PA-C  buPROPion (WELLBUTRIN XL) 150 MG 24 hr tablet Take 150 mg by mouth daily.    [provider]  celecoxib (CELEBREX) 200 MG capsule Take 1 capsule (200 mg total) by mouth 2 (two) times daily. Patient not taking: Reported on 08/31/2020 10/06/11   Moreno-Coll, Adlih, MD  cyclobenzaprine (FLEXERIL) 10 MG tablet Take 1 tablet (10 mg total) by mouth 3 (three) times daily as needed for muscle spasms. 04/02/18   Street, [place], PA-C  dexlansoprazole (DEXILANT) 60 MG capsule Take 60 mg by mouth daily.    [provider]  diclofenac Sodium (VOLTAREN) 1 % GEL Apply 2 g topically 4 (four) times daily.    [provider]  gabapentin (NEURONTIN) 100 MG capsule Take 1 capsule (100 mg total) by mouth 3 (three) times daily as  needed. 08/31/20   Sabas Sous, MD  hydrochlorothiazide (HYDRODIURIL) 25 MG tablet Take 25 mg by mouth daily.      [provider]  hydrOXYzine (ATARAX/VISTARIL) 50 MG tablet Take 50 mg by mouth 2 (two) times daily as needed for anxiety or itching.  01/25/17   [provider]  meloxicam (MOBIC) 15 MG tablet Take 15 mg by mouth daily.    [provider]  Multiple Vitamins-Minerals (HAIR SKIN AND NAILS FORMULA PO) Take 1 tablet by mouth daily.    [provider]  naproxen (NAPROSYN) 500 MG tablet Take 1  tablet (500 mg total) by mouth 2 (two) times daily as needed for mild pain, moderate pain or headache (TAKE WITH MEALS.). Patient not taking: Reported on 08/31/2020 04/02/18   Street, [place], PA-C  promethazine (PHENERGAN) 12.5 MG tablet Take 12.5 mg by mouth every 6 (six) hours as needed for nausea or vomiting.    [provider]  traZODone (DESYREL) 100 MG tablet Take 100 mg by mouth at bedtime.      [provider]  triamcinolone ointment (KENALOG) 0.1 % Apply 1 application topically daily. 02/06/17   [provider]    Family History No family history on file.  Social History Social History   Tobacco Use  . Smoking status: Never Smoker  . Smokeless tobacco: Never Used  Substance Use Topics  . Alcohol use: Yes  . Drug use: No     Allergies   Doxycycline, Hydromorphone hcl, Ibuprofen, Iohexol, Morphine sulfate, Sulfamethoxazole-trimethoprim, and Contrast media [iodinated diagnostic agents]   Review of Systems Review of Systems Per HPI  Physical Exam Triage Vital Signs ED Triage Vitals  Enc Vitals Group     BP 12/15/20 1215 (!) 189/109     Pulse Rate 12/15/20 1215 62     Resp 12/15/20 1215 16     Temp 12/15/20 1215 98.6 F (37 C)     Temp Source 12/15/20 1215 Oral     SpO2 12/15/20 1214 100 %     Weight --      Height --      Head Circumference --      Peak Flow --      Pain Score 12/15/20 1213 8     Pain Loc --      Pain Edu? --      Excl. in GC? --    No data found.  Updated Vital Signs BP (!) 189/109 (BP Location: Right Arm)   Pulse 62   Temp 98.6 F (37 C) (Oral)   Resp 16   LMP 06/28/2010   SpO2 100%   Visual Acuity Right Eye Distance:   Left Eye Distance:   Bilateral Distance:    Right Eye Near:   Left Eye Near:    Bilateral Near:     Physical Exam Vitals and nursing note reviewed.  Constitutional:      Appearance: Normal appearance. She is not ill-appearing.  HENT:     Head: Atraumatic.  Eyes:      Extraocular Movements: Extraocular movements intact.     Conjunctiva/sclera: Conjunctivae normal.  Cardiovascular:     Rate and Rhythm: Normal rate and regular rhythm.     Heart sounds: Normal heart sounds.  Pulmonary:     Effort: Pulmonary effort is normal.     Breath sounds: Normal breath sounds.  Musculoskeletal:        General: Normal range of motion.     Cervical back: Normal range  of motion and neck supple.  Skin:    General: Skin is warm and dry.     Findings: Rash present.     Comments: Erythematous clusters of papules and blisters in linear fashion from midline left mid back extending around to anterior left rib region.  Significantly tender to palpation.  No active drainage.  Neurological:     Mental Status: She is alert and oriented to person, place, and time.  Psychiatric:        Mood and Affect: Mood normal.        Thought Content: Thought content normal.        Judgment: Judgment normal.      UC Treatments / Results  Labs (all labs ordered are listed, but only abnormal results are displayed) Labs Reviewed - No data to display  EKG   Radiology No results found.  Procedures Procedures (including critical care time)  Medications Ordered in UC Medications  ketorolac (TORADOL) injection 60 mg (60 mg Intramuscular Given 12/15/20 1245)    Initial Impression / Assessment and Plan / UC Course  I have reviewed the triage vital signs and the nursing notes.  Pertinent labs & imaging results that were available during my care of the patient were reviewed by me and considered in my medical decision making (see chart for details).     Consistent with shingles rash.  Will treat with prednisone burst, Valtrex, clobetasol ointment, over-the-counter pain relievers.  Work note given.   Final Clinical Impressions(s) / UC Diagnoses   Final diagnoses:  Herpes zoster without complication   Discharge Instructions   None    ED Prescriptions    Medication Sig Dispense  Auth. Provider   predniSONE (DELTASONE) 20 MG tablet Take 2 tablets (40 mg total) by mouth daily with breakfast. 10 tablet Particia Nearing, PA-C   valACYclovir (VALTREX) 1000 MG tablet Take 1 tablet (1,000 mg total) by mouth 3 (three) times daily for 7 days. 21 tablet Particia Nearing, [place]   clobetasol ointment (TEMOVATE) 0.05 % Apply 1 application topically 2 (two) times daily. 60 g Particia Nearing, [place]     PDMP not reviewed this encounter.   Particia Nearing, [place] 12/15/20 1332

## 2020-12-15 NOTE — ED Notes (Signed)
Pt called 3x for room placement. Eloped from waiting area.  

## 2020-12-29 ENCOUNTER — Ambulatory Visit (HOSPITAL_COMMUNITY)
Admission: EM | Admit: 2020-12-29 | Discharge: 2020-12-29 | Disposition: A | Discharge status: Home / Self Care | Payer: Medicare Other | Attending: Family Medicine | Admitting: Family Medicine

## 2020-12-29 ENCOUNTER — Encounter (HOSPITAL_COMMUNITY): Payer: Self-pay | Admitting: Emergency Medicine

## 2020-12-29 ENCOUNTER — Other Ambulatory Visit: Payer: Self-pay

## 2020-12-29 DIAGNOSIS — B028 Zoster with other complications: Secondary | ICD-10-CM | POA: Diagnosis not present

## 2020-12-29 DIAGNOSIS — I10 Essential (primary) hypertension: Secondary | ICD-10-CM

## 2020-12-29 HISTORY — DX: Zoster without complications: B02.9

## 2020-12-29 MED ORDER — CAPSAICIN 0.075 % EX CREA: 1.0000 "application " | TOPICAL_CREAM | Freq: Two times a day (BID) | CUTANEOUS | 0 refills | Status: DC | PRN

## 2020-12-29 MED ORDER — OXYCODONE-ACETAMINOPHEN 5-325 MG PO TABS: 1.0000 | ORAL_TABLET | Freq: Four times a day (QID) | ORAL | 0 refills | Status: DC | PRN

## 2020-12-29 MED ORDER — ONDANSETRON 4 MG PO TBDP: 4.0000 mg | ORAL_TABLET | Freq: Three times a day (TID) | ORAL | 0 refills | Status: DC | PRN

## 2020-12-29 NOTE — Discharge Instructions (Signed)
Be aware, you have been prescribed pain medications that may cause drowsiness. While taking this medication, do not take any other medications containing acetaminophen (Tylenol). Do not combine with alcohol or other illicit drugs. Please do not drive, operate heavy machinery, or take part in activities that require making important decisions while on this medication as your judgement may be clouded.  Your blood pressure was noted to be elevated during your visit today. If you are currently taking medication for high blood pressure, please ensure you are taking this as directed. If you do not have a history of high blood pressure and your blood pressure remains persistently elevated, you may need to begin taking a medication at some point. You may return here within the next few days to recheck if unable to see your primary care provider or if you do not have a one.  BP (!) 231/116 (BP Location: Right Arm)   Pulse 67   Temp 98.1 F (36.7 C) (Oral)   Resp (!) 22   LMP 06/28/2010   SpO2 100%   BP Readings from Last 3 Encounters:  12/29/20 (!) 231/116  12/15/20 (!) 189/109  12/15/20 (!) 216/112

## 2020-12-29 NOTE — ED Provider Notes (Signed)
[facility]   [id number] 12/29/20 Arrival Time: 6195  ASSESSMENT & PLAN:  1. Herpes zoster with other complication   2. Elevated blood pressure reading in office with diagnosis of hypertension    No signs of skin infection.  Begin: Meds ordered this encounter  Medications  . oxyCODONE-acetaminophen (PERCOCET/ROXICET) 5-325 MG tablet    Sig: Take 1 tablet by mouth every 6 (six) hours as needed for severe pain.    Dispense:  15 tablet    Refill:  0  . capsicum (ZOSTRIX) 0.075 % topical cream    Sig: Apply 1 application topically 2 (two) times daily as needed.    Dispense:  28.3 g    Refill:  0  . ondansetron (ZOFRAN-ODT) 4 MG disintegrating tablet    Sig: Take 1 tablet (4 mg total) by mouth every 8 (eight) hours as needed for nausea or vomiting.    Dispense:  15 tablet    Refill:  0     Discharge Instructions      Be aware, you have been prescribed pain medications that may cause drowsiness. While taking this medication, do not take any other medications containing acetaminophen (Tylenol). Do not combine with alcohol or other illicit drugs. Please do not drive, operate heavy machinery, or take part in activities that require making important decisions while on this medication as your judgement may be clouded.  Your blood pressure was noted to be elevated during your visit today. If you are currently taking medication for high blood pressure, please ensure you are taking this as directed. If you do not have a history of high blood pressure and your blood pressure remains persistently elevated, you may need to begin taking a medication at some point. You may return here within the next few days to recheck if unable to see your primary care provider or if you do not have a one.  BP (!) 231/116 (BP Location: Right Arm)   Pulse 67   Temp 98.1 F (36.7 C) (Oral)   Resp (!) 22   LMP 06/28/2010   SpO2 100%   BP Readings from Last 3 Encounters:  12/29/20 (!) 231/116   12/15/20 (!) 189/109  12/15/20 (!) 216/112          Follow-up Information    Schedule an appointment as soon as possible for a visit  with Shepherd-Banigan, Glenice Laine, MD.   Specialty: Family Medicine Contact information: [address] [place] [address] [phone]        [facility].   Specialty: Urgent Care Why: If worsening or failing to improve as anticipated. Contact information: [address] [place] [address] [phone]              Reviewed expectations re: course of current medical issues. Questions answered. Outlined signs and symptoms indicating need for more acute intervention. Understanding verbalized. After Visit Summary given.   SUBJECTIVE: History from: patient. Shannon Mayo is a 55 y.o. female who was seen here recently; last note reviewed by me; dx with shingles. "Pain is out of control". Sharp and burning. OTC analgesics without relief. Afebrile.  Increased blood pressure noted today. Reports that she has been treated for hypertension in the past. Out of medication. Looking for a PCP. She reports no chest pain on exertion, no dyspnea on exertion, no swelling of ankles, no orthostatic dizziness or lightheadedness, no orthopnea or paroxysmal nocturnal dyspnea, no palpitations and no intermittent claudication symptoms.  OBJECTIVE:  Vitals:   12/29/20 0956  BP: (!) 231/116  Pulse: 67  Resp: (!) 22  Temp: 98.1 F (36.7 C)  TempSrc: Oral  SpO2: 100%    Abd VS noted. She appears to be in significant pain. General appearance: alert Eyes: PERRLA; EOMI; conjunctiva normal HENT: [place]; AT Neck: supple  CV: regular Lungs: speaks full sentences without difficulty; unlabored Extremities: no edema Skin: warm and dry; skin changes consistent with zoster on L chest wall to back; no sign of bacterial infection; very TTP Neurologic: normal gait Psychological: alert and  cooperative; normal mood and affect   Allergies  Allergen Reactions  . Doxycycline Other (See Comments)    Stomach pain.  Marland Kitchen Hydromorphone Hcl Other (See Comments)    Stomach pain.  . Ibuprofen     REACTION: GI upset  . Iohexol      Desc: PATIENT STATES ALLERGY TO IV CONTRAST, SHE INDICATED SEVERE ONSET OF ABDOMINAL PAIN.  I EXPLAINED THIS MAY NOT BE AN ACTUAL ALLERGY, BUT SHE DISAGREED.  NO IV CONTRAST GIVEN TODAY, 09-23-07. LW, Onset Date: [id number]   . Morphine Sulfate Other (See Comments)    Stomach pain.  . Sulfamethoxazole-Trimethoprim Other (See Comments)    Stomach pain.  . Contrast Media [Iodinated Diagnostic Agents] Rash    Past Medical History:  Diagnosis Date  . Acid reflux   . Arthritis   . Depression   . Fibromyalgia   . Hypertension   . IBS (irritable bowel syndrome)   . Shingles    Social History   Socioeconomic History  . Marital status: Married    Spouse name: Not on file  . Number of children: Not on file  . Years of education: Not on file  . Highest education level: Not on file  Occupational History  . Not on file  Tobacco Use  . Smoking status: Never Smoker  . Smokeless tobacco: Never Used  Substance and Sexual Activity  . Alcohol use: Yes  . Drug use: No  . Sexual activity: Not on file  Other Topics Concern  . Not on file  Social History Narrative  . Not on file   Social Determinants of Health   Financial Resource Strain: Not on file  Food Insecurity: Not on file  Transportation Needs: Not on file  Physical Activity: Not on file  Stress: Not on file  Social Connections: Not on file  Intimate Partner Violence: Not on file   History reviewed. No pertinent family history. Past Surgical History:  Procedure Laterality Date  . CHOLECYSTECTOMY    . CYSTECTOMY Left 1996   on the neck   . TUBAL LIGATION       Mardella Layman, MD 12/29/20 [phone]

## 2020-12-29 NOTE — ED Triage Notes (Signed)
Pt presents today with continued pain to left back that radiates to left ribs. She reports being dx with Shingles and has finished medication, but pain is out of control.

## 2020-12-29 NOTE — ED Notes (Signed)
Dr. Tracie Harrier notified of elevated blood pressure and pain

## 2021-06-26 ENCOUNTER — Other Ambulatory Visit: Payer: Self-pay

## 2021-06-26 ENCOUNTER — Ambulatory Visit (HOSPITAL_COMMUNITY)
Admission: EM | Admit: 2021-06-26 | Discharge: 2021-06-26 | Disposition: A | Discharge status: Home / Self Care | Payer: Medicare Other | Attending: Urgent Care | Admitting: Urgent Care

## 2021-06-26 ENCOUNTER — Encounter (HOSPITAL_COMMUNITY): Payer: Self-pay | Admitting: *Deleted

## 2021-06-26 ENCOUNTER — Ambulatory Visit (INDEPENDENT_AMBULATORY_CARE_PROVIDER_SITE_OTHER): Payer: Medicare Other

## 2021-06-26 DIAGNOSIS — M79672 Pain in left foot: Secondary | ICD-10-CM

## 2021-06-26 MED ORDER — MELOXICAM 7.5 MG PO TABS: 7.5000 mg | ORAL_TABLET | Freq: Every day | ORAL | 0 refills | Status: DC

## 2021-06-26 NOTE — ED Provider Notes (Signed)
[name]   MRN: [id number] DOB: [birth year]  Subjective:   Shannon Mayo is a 55 y.o. female presenting for 1 week history of acute onset left foot pain worse over the distal dorsal aspect of her left foot.  Denies any particular trauma, swelling, ecchymosis, bony deformity.  Patient states that she woke up and her foot has continued to hurt her.  Denies any history of foot injuries.  She does have a history of arthritis.  No history of musculoskeletal disorders otherwise.  No current facility-administered medications for this encounter.  Current Outpatient Medications:    buPROPion (WELLBUTRIN XL) 150 MG 24 hr tablet, Take 150 mg by mouth daily., Disp: , Rfl:    capsicum (ZOSTRIX) 0.075 % topical cream, Apply 1 application topically 2 (two) times daily as needed., Disp: 28.3 g, Rfl: 0   celecoxib (CELEBREX) 200 MG capsule, Take 1 capsule (200 mg total) by mouth 2 (two) times daily. (Patient not taking: Reported on 08/31/2020), Disp: 60 capsule, Rfl: 0   clobetasol ointment (TEMOVATE) 0.05 %, Apply 1 application topically 2 (two) times daily., Disp: 60 g, Rfl: 0   cyclobenzaprine (FLEXERIL) 10 MG tablet, Take 1 tablet (10 mg total) by mouth 3 (three) times daily as needed for muscle spasms., Disp: 15 tablet, Rfl: 0   dexlansoprazole (DEXILANT) 60 MG capsule, Take 60 mg by mouth daily., Disp: , Rfl:    diclofenac Sodium (VOLTAREN) 1 % GEL, Apply 2 g topically 4 (four) times daily., Disp: , Rfl:    gabapentin (NEURONTIN) 100 MG capsule, Take 1 capsule (100 mg total) by mouth 3 (three) times daily as needed., Disp: 30 capsule, Rfl: 0   hydrochlorothiazide (HYDRODIURIL) 25 MG tablet, Take 25 mg by mouth daily., Disp: , Rfl:    hydrOXYzine (ATARAX/VISTARIL) 50 MG tablet, Take 50 mg by mouth 2 (two) times daily as needed for anxiety or itching. , Disp: , Rfl: 1   meloxicam (MOBIC) 15 MG tablet, Take 15 mg by mouth daily., Disp: , Rfl:    Multiple Vitamins-Minerals  (HAIR SKIN AND NAILS FORMULA PO), Take 1 tablet by mouth daily., Disp: , Rfl:    naproxen (NAPROSYN) 500 MG tablet, Take 1 tablet (500 mg total) by mouth 2 (two) times daily as needed for mild pain, moderate pain or headache (TAKE WITH MEALS.). (Patient not taking: Reported on 08/31/2020), Disp: 20 tablet, Rfl: 0   ondansetron (ZOFRAN-ODT) 4 MG disintegrating tablet, Take 1 tablet (4 mg total) by mouth every 8 (eight) hours as needed for nausea or vomiting., Disp: 15 tablet, Rfl: 0   oxyCODONE-acetaminophen (PERCOCET/ROXICET) 5-325 MG tablet, Take 1 tablet by mouth every 6 (six) hours as needed for severe pain., Disp: 15 tablet, Rfl: 0   predniSONE (DELTASONE) 20 MG tablet, Take 2 tablets (40 mg total) by mouth daily with breakfast., Disp: 10 tablet, Rfl: 0   promethazine (PHENERGAN) 12.5 MG tablet, Take 12.5 mg by mouth every 6 (six) hours as needed for nausea or vomiting., Disp: , Rfl:    traZODone (DESYREL) 100 MG tablet, Take 100 mg by mouth at bedtime.  , Disp: , Rfl:    triamcinolone ointment (KENALOG) 0.1 %, Apply 1 application topically daily., Disp: , Rfl:    Allergies  Allergen Reactions   Doxycycline Other (See Comments)    Stomach pain.   Hydromorphone Hcl Other (See Comments)    Stomach pain.   Ibuprofen     REACTION: GI upset   Iohexol  Desc: PATIENT STATES ALLERGY TO IV CONTRAST, SHE INDICATED SEVERE ONSET OF ABDOMINAL PAIN.  I EXPLAINED THIS MAY NOT BE AN ACTUAL ALLERGY, BUT SHE DISAGREED.  NO IV CONTRAST GIVEN TODAY, 09-23-07. LW, Onset Date: [id number]    Morphine Sulfate Other (See Comments)    Stomach pain.   Sulfamethoxazole-Trimethoprim Other (See Comments)    Stomach pain.   Contrast Media [Iodinated Diagnostic Agents] Rash    Past Medical History:  Diagnosis Date   Acid reflux    Arthritis    Depression    Fibromyalgia    Hypertension    IBS (irritable bowel syndrome)    Shingles      Past Surgical History:  Procedure Laterality Date   CHOLECYSTECTOMY      CYSTECTOMY Left 1996   on the neck    TUBAL LIGATION      History reviewed. No pertinent family history.  Social History   Tobacco Use   Smoking status: Never   Smokeless tobacco: Never  Substance Use Topics   Alcohol use: Yes   Drug use: No    ROS   Objective:   Vitals: BP (!) 167/89   Pulse 64   Temp 98.6 F (37 C)   Resp 16   LMP 06/28/2010   SpO2 97%   Physical Exam Constitutional:      General: She is not in acute distress.    Appearance: Normal appearance. She is well-developed. She is not ill-appearing, toxic-appearing or diaphoretic.  HENT:     Head: Normocephalic and atraumatic.     Nose: Nose normal.     Mouth/Throat:     Mouth: Mucous membranes are moist.     Pharynx: Oropharynx is clear.  Eyes:     General: No scleral icterus.       Right eye: No discharge.        Left eye: No discharge.     Extraocular Movements: Extraocular movements intact.     Conjunctiva/sclera: Conjunctivae normal.     Pupils: Pupils are equal, round, and reactive to light.  Cardiovascular:     Rate and Rhythm: Normal rate.  Pulmonary:     Effort: Pulmonary effort is normal.  Musculoskeletal:       Feet:  Skin:    General: Skin is warm and dry.  Neurological:     General: No focal deficit present.     Mental Status: She is alert and oriented to person, place, and time.     Motor: No weakness.     Coordination: Coordination normal.     Gait: Gait normal.     Deep Tendon Reflexes: Reflexes normal.  Psychiatric:        Mood and Affect: Mood normal.        Behavior: Behavior normal.        Thought Content: Thought content normal.        Judgment: Judgment normal.    DG Foot Complete Left  Result Date: 06/26/2021 CLINICAL DATA:  Acute LEFT foot pain for 1 week.  Initial encounter. EXAM: LEFT FOOT - COMPLETE 3+ VIEW COMPARISON:  None. FINDINGS: There is no evidence of fracture or dislocation. There is no evidence of arthropathy or other focal bone abnormality.  Soft tissues are unremarkable. IMPRESSION: Negative. Electronically Signed   By: Harmon Pier M.D.   On: 06/26/2021 11:59     Assessment and Plan :   PDMP not reviewed this encounter.  1. Foot pain, left     Suspect inflammatory type  pain, reassuring x-ray and physical exam findings.  Recommended meloxicam at 7.5 mg once daily, postop shoe.  Follow-up with podiatry. Counseled patient on potential for adverse effects with medications prescribed/recommended today, ER and return-to-clinic precautions discussed, patient verbalized understanding.    Wallis Bamberg, [place] 06/26/21 1232

## 2021-06-26 NOTE — Discharge Instructions (Addendum)
[facility] ([place]) Podiatrist in [place], [place] COVID-19 info: [email or web address] Get online care: [email or web address] Address: [address], [place], [place] [address] Phone: [phone] Appointments: [email or web address]   [facility], [place], [place] Doctor in , [place] Address: [address], , [place] [address] Phone: [phone]  

## 2021-06-26 NOTE — ED Triage Notes (Signed)
Pt reports Lt foot pain that started 1 week ago.

## 2021-07-02 ENCOUNTER — Other Ambulatory Visit: Payer: Self-pay

## 2021-07-02 ENCOUNTER — Telehealth (HOSPITAL_COMMUNITY): Payer: Self-pay

## 2021-07-02 ENCOUNTER — Encounter (HOSPITAL_COMMUNITY): Payer: Self-pay | Admitting: Emergency Medicine

## 2021-07-02 ENCOUNTER — Ambulatory Visit (HOSPITAL_COMMUNITY)
Admission: EM | Admit: 2021-07-02 | Discharge: 2021-07-02 | Disposition: A | Discharge status: Home / Self Care | Payer: Medicare Other | Attending: Student | Admitting: Student

## 2021-07-02 DIAGNOSIS — I10 Essential (primary) hypertension: Secondary | ICD-10-CM

## 2021-07-02 DIAGNOSIS — K047 Periapical abscess without sinus: Secondary | ICD-10-CM

## 2021-07-02 MED ORDER — AMOXICILLIN-POT CLAVULANATE 875-125 MG PO TABS: 1.0000 | ORAL_TABLET | Freq: Two times a day (BID) | ORAL | 0 refills | Status: DC

## 2021-07-02 MED ORDER — LIDOCAINE VISCOUS HCL 2 % MT SOLN: 15.0000 mL | OROMUCOSAL | 0 refills | Status: DC | PRN

## 2021-07-02 NOTE — Discharge Instructions (Addendum)
-  Start the antibiotic-Augmentin (amoxicillin-clavulanate), 1 pill every 12 hours for 7 days.  You can take this with food like with breakfast and dinner. -For dental pain, use lidocaine mouthwash up to every 4 hours. Make sure not to eat for at least 1 hour after using this, as your mouth will be very numb and you could bite yourself. -Please check your blood pressure at home or at the pharmacy. If this continues to be >140/90, follow-up with your primary care provider for further blood pressure management/ medication titration. If you develop chest pain, shortness of breath, vision changes, the worst headache of your life- head straight to the ED or call 911.

## 2021-07-02 NOTE — ED Provider Notes (Signed)
[facility]    CSN: [id number] Arrival date & time: 07/02/21  1342      History   Chief Complaint Chief Complaint  Patient presents with   Oral Swelling    HPI Shannon Mayo is a 55 y.o. female presenting with dental pain. Medical history fibromyalgia, arthritis, GERD. Notes pain and swelling L lower jaw x2 days. Swelling is actually improved today.  States she has a broken left lower molar that's causing her pain inside the mouth. Denies foul taste in mouth, pain under tongue, pain under her jaw, fever/chills, discharge inside the mouth.  Meloxicam that she has at home is providing some relief.  HPI  Past Medical History:  Diagnosis Date   Acid reflux    Arthritis    Depression    Fibromyalgia    Hypertension    IBS (irritable bowel syndrome)    Shingles     Patient Active Problem List   Diagnosis Date Noted   Major depressive disorder, recurrent episode, moderate with anxious distress (HCC) 04/18/2020   PRURITUS 09/22/2008   KNEE PAIN, BILATERAL 09/22/2008   ROTATOR CUFF SYNDROME, LEFT 09/22/2008   NONSPECIFIC ABNORMAL RESULTS LIVR FUNCTION STUDY 05/08/2008   BOILS, RECURRENT 05/06/2008   HYPERMETROPIA 03/10/2008   NASAL POLYP 03/10/2008   HYPERTENSION 11/16/2007   PERIMENOPAUSAL STATUS 07/05/2007   SHOULDER PAIN, RIGHT, CHRONIC 07/05/2007   AMENORRHEA, SECONDARY 06/18/2007   FIBROMYALGIA 03/19/2007   METHICILLIN RESISTANT STAPHYLOCOCCUS AUREUS INFECTION 12/01/2006   ANXIETY 12/01/2006   DISORDER, DEPRESSIVE NEC 12/01/2006   MIGRAINE HEADACHE 12/01/2006   ALLERGIC RHINITIS, SEASONAL 12/01/2006   GERD 12/01/2006   GASTRITIS 12/01/2006   IRRITABLE BOWEL SYNDROME 12/01/2006    Past Surgical History:  Procedure Laterality Date   CHOLECYSTECTOMY     CYSTECTOMY Left 1996   on the neck    TUBAL LIGATION      OB History   No obstetric history on file.      Home Medications    Prior to Admission medications   Medication Sig Start  Date End Date Taking? Authorizing Provider  amoxicillin-clavulanate (AUGMENTIN) 875-125 MG tablet Take 1 tablet by mouth every 12 (twelve) hours. 07/02/21  Yes Rhys Martini, PA-C  lidocaine (XYLOCAINE) 2 % solution Use as directed 15 mLs in the mouth or throat as needed for mouth pain. 07/02/21  Yes Rhys Martini, PA-C  buPROPion (WELLBUTRIN XL) 150 MG 24 hr tablet Take 150 mg by mouth daily.    [provider]  capsicum (ZOSTRIX) 0.075 % topical cream Apply 1 application topically 2 (two) times daily as needed. 12/29/20   Mardella Layman, MD  celecoxib (CELEBREX) 200 MG capsule Take 1 capsule (200 mg total) by mouth 2 (two) times daily. Patient not taking: Reported on 08/31/2020 10/06/11   Moreno-Coll, Adlih, MD  clobetasol ointment (TEMOVATE) 0.05 % Apply 1 application topically 2 (two) times daily. 12/15/20   Particia Nearing, PA-C  cyclobenzaprine (FLEXERIL) 10 MG tablet Take 1 tablet (10 mg total) by mouth 3 (three) times daily as needed for muscle spasms. 04/02/18   Street, [place], PA-C  dexlansoprazole (DEXILANT) 60 MG capsule Take 60 mg by mouth daily.    [provider]  diclofenac Sodium (VOLTAREN) 1 % GEL Apply 2 g topically 4 (four) times daily.    [provider]  gabapentin (NEURONTIN) 100 MG capsule Take 1 capsule (100 mg total) by mouth 3 (three) times daily as needed. 08/31/20   Sabas Sous, MD  hydrochlorothiazide (  HYDRODIURIL) 25 MG tablet Take 25 mg by mouth daily.    [provider]  hydrOXYzine (ATARAX/VISTARIL) 50 MG tablet Take 50 mg by mouth 2 (two) times daily as needed for anxiety or itching.  01/25/17   [provider]  meloxicam (MOBIC) 7.5 MG tablet Take 1 tablet (7.5 mg total) by mouth daily. 06/26/21   Wallis Bamberg, PA-C  Multiple Vitamins-Minerals (HAIR SKIN AND NAILS FORMULA PO) Take 1 tablet by mouth daily.    [provider]  naproxen (NAPROSYN) 500 MG tablet Take 1 tablet (500 mg total) by mouth 2 (two)  times daily as needed for mild pain, moderate pain or headache (TAKE WITH MEALS.). Patient not taking: Reported on 08/31/2020 04/02/18   Street, [place], PA-C  ondansetron (ZOFRAN-ODT) 4 MG disintegrating tablet Take 1 tablet (4 mg total) by mouth every 8 (eight) hours as needed for nausea or vomiting. 12/29/20   Mardella Layman, MD  oxyCODONE-acetaminophen (PERCOCET/ROXICET) 5-325 MG tablet Take 1 tablet by mouth every 6 (six) hours as needed for severe pain. 12/29/20   Mardella Layman, MD  predniSONE (DELTASONE) 20 MG tablet Take 2 tablets (40 mg total) by mouth daily with breakfast. 12/15/20   Particia Nearing, PA-C  promethazine (PHENERGAN) 12.5 MG tablet Take 12.5 mg by mouth every 6 (six) hours as needed for nausea or vomiting.    [provider]  traZODone (DESYREL) 100 MG tablet Take 100 mg by mouth at bedtime.      [provider]  triamcinolone ointment (KENALOG) 0.1 % Apply 1 application topically daily. 02/06/17   [provider]    Family History No family history on file.  Social History Social History   Tobacco Use   Smoking status: Never   Smokeless tobacco: Never  Substance Use Topics   Alcohol use: Yes   Drug use: No     Allergies   Doxycycline, Hydromorphone hcl, Ibuprofen, Iohexol, Morphine sulfate, Sulfamethoxazole-trimethoprim, and Contrast media [iodinated diagnostic agents]   Review of Systems Review of Systems  HENT:  Positive for dental problem.   All other systems reviewed and are negative.   Physical Exam Triage Vital Signs ED Triage Vitals  Enc Vitals Group     BP 07/02/21 1448 (!) 208/98     Pulse Rate 07/02/21 1448 72     Resp 07/02/21 1448 17     Temp 07/02/21 1448 98.6 F (37 C)     Temp Source 07/02/21 1448 Oral     SpO2 07/02/21 1448 99 %     Weight --      Height --      Head Circumference --      Peak Flow --      Pain Score 07/02/21 1447 8     Pain Loc --      Pain Edu? --      Excl. in GC? --    No  data found.  Updated Vital Signs BP (!) 208/98 (BP Location: Left Arm) Comment: hasnt had HTN medications today  Pulse 72   Temp 98.6 F (37 C) (Oral)   Resp 17   LMP 06/28/2010   SpO2 99%   Visual Acuity Right Eye Distance:   Left Eye Distance:   Bilateral Distance:    Right Eye Near:   Left Eye Near:    Bilateral Near:     Physical Exam Vitals reviewed.  Constitutional:      General: She is not in acute distress.    Appearance:  Normal appearance. She is not ill-appearing, toxic-appearing or diaphoretic.  HENT:     Head: Normocephalic and atraumatic.     Jaw: There is normal jaw occlusion. No trismus, tenderness, swelling, pain on movement or malocclusion.     Salivary Glands: Right salivary gland is not diffusely enlarged or tender. Left salivary gland is not diffusely enlarged or tender.     Right Ear: Hearing normal.     Left Ear: Hearing normal.     Nose: Nose normal.     Mouth/Throat:     Lips: Pink.     Mouth: Mucous membranes are moist. No lacerations or oral lesions.     Dentition: Abnormal dentition. Does not have dentures. Dental tenderness, dental caries and dental abscesses present.     Tongue: No lesions. Tongue does not deviate from midline.     Palate: No mass.     Pharynx: Oropharynx is clear. Uvula midline. No oropharyngeal exudate or posterior oropharyngeal erythema.     Tonsils: No tonsillar exudate or tonsillar abscesses.     Comments: Poor dentician  Broken L lower molar with tenderness surrounding. Corresponding L jaw with external swelling and tenderness. No trismus, drooling, sore throat, voice changes, swelling underneath the tongue, swelling underneath the jaw, neck stiffness.  Eyes:     Extraocular Movements: Extraocular movements intact.     Pupils: Pupils are equal, round, and reactive to light.  Pulmonary:     Effort: Pulmonary effort is normal.  Neurological:     General: No focal deficit present.     Mental Status: She is alert and  oriented to person, place, and time.  Psychiatric:        Mood and Affect: Mood normal.        Behavior: Behavior normal.        Thought Content: Thought content normal.        Judgment: Judgment normal.     UC Treatments / Results  Labs (all labs ordered are listed, but only abnormal results are displayed) Labs Reviewed - No data to display  EKG   Radiology No results found.  Procedures Procedures (including critical care time)  Medications Ordered in UC Medications - No data to display  Initial Impression / Assessment and Plan / UC Course  I have reviewed the triage vital signs and the nursing notes.  Pertinent labs & imaging results that were available during my care of the patient were reviewed by me and considered in my medical decision making (see chart for details).     This patient is a very pleasant 55 y.o. year old female presenting with dental abscess. Today this pt is afebrile nontachycardic.  Augmentin, viscous lidocaine.  Follow-up with dentist at their earliest convenience.  For blood pressure, this is significantly elevated as patient has not taken her blood pressure medications yet today.  Denies headaches, chest pain, shortness of breath, dizziness, vision changes.  ED return precautions discussed. Patient verbalizes understanding and agreement.    Final Clinical Impressions(s) / UC Diagnoses   Final diagnoses:  Dental abscess  Essential hypertension     Discharge Instructions      -Start the antibiotic-Augmentin (amoxicillin-clavulanate), 1 pill every 12 hours for 7 days.  You can take this with food like with breakfast and dinner. -For dental pain, use lidocaine mouthwash up to every 4 hours. Make sure not to eat for at least 1 hour after using this, as your mouth will be very numb and you could bite yourself. -Please  check your blood pressure at home or at the pharmacy. If this continues to be >140/90, follow-up with your primary care  provider for further blood pressure management/ medication titration. If you develop chest pain, shortness of breath, vision changes, the worst headache of your life- head straight to the ED or call 911.     ED Prescriptions     Medication Sig Dispense Auth. Provider   amoxicillin-clavulanate (AUGMENTIN) 875-125 MG tablet Take 1 tablet by mouth every 12 (twelve) hours. 14 tablet Ignacia Bayley E, PA-C   lidocaine (XYLOCAINE) 2 % solution Use as directed 15 mLs in the mouth or throat as needed for mouth pain. 100 mL Rhys Martini, PA-C      PDMP not reviewed this encounter.   Rhys Martini, PA-C 07/02/21 [phone]

## 2021-07-02 NOTE — ED Triage Notes (Signed)
Pt c/o left sided facial swelling that started yesterday. Denies any tooth related pains.

## 2021-09-05 ENCOUNTER — Ambulatory Visit (HOSPITAL_COMMUNITY)
Admission: EM | Admit: 2021-09-05 | Discharge: 2021-09-05 | Disposition: A | Discharge status: Home / Self Care | Payer: Medicare Other | Attending: Urgent Care | Admitting: Urgent Care

## 2021-09-05 ENCOUNTER — Other Ambulatory Visit: Payer: Self-pay

## 2021-09-05 ENCOUNTER — Encounter (HOSPITAL_COMMUNITY): Payer: Self-pay | Admitting: *Deleted

## 2021-09-05 ENCOUNTER — Ambulatory Visit (INDEPENDENT_AMBULATORY_CARE_PROVIDER_SITE_OTHER): Payer: Medicare Other

## 2021-09-05 DIAGNOSIS — M546 Pain in thoracic spine: Secondary | ICD-10-CM | POA: Diagnosis not present

## 2021-09-05 DIAGNOSIS — I160 Hypertensive urgency: Secondary | ICD-10-CM

## 2021-09-05 DIAGNOSIS — R0782 Intercostal pain: Secondary | ICD-10-CM

## 2021-09-05 DIAGNOSIS — I10 Essential (primary) hypertension: Secondary | ICD-10-CM

## 2021-09-05 DIAGNOSIS — R0789 Other chest pain: Secondary | ICD-10-CM

## 2021-09-05 DIAGNOSIS — Z8619 Personal history of other infectious and parasitic diseases: Secondary | ICD-10-CM

## 2021-09-05 MED ORDER — TIZANIDINE HCL 4 MG PO TABS: 4.0000 mg | ORAL_TABLET | Freq: Every day | ORAL | 0 refills | Status: DC

## 2021-09-05 MED ORDER — TRAMADOL HCL 50 MG PO TABS: 50.0000 mg | ORAL_TABLET | Freq: Four times a day (QID) | ORAL | 0 refills | Status: DC | PRN

## 2021-09-05 NOTE — Discharge Instructions (Signed)
Please schedule Tylenol at 500 mg - 650 mg once every 6 hours as needed for aches and pains.  If you still have pain despite taking Tylenol regularly, this is breakthrough pain.  You can use tramadol once every 6 hours for this.  Once your pain is better controlled, switch back to just Tylenol. Use tizanidine as well.

## 2021-09-05 NOTE — ED Provider Notes (Signed)
[facility]   MRN: [id number] DOB: 1965-12-29  Subjective:   Shannon Mayo is a 55 y.o. female presenting for 2-3 day history of acute onset right-sided thoracic pain chest wall pain.  Has been using over-the-counter medications without relief.  No fever, runny or stuffy nose, sore throat, cough, chest pain, shortness of breath, nausea, vomiting, abdominal pain regarding her blood pressure, states that she tries to be compliant with her blood pressure medications.  No history of stroke or MI.  Denies confusion, weakness, numbness or tingling, hematuria.  No current facility-administered medications for this encounter.  Current Outpatient Medications:    amoxicillin-clavulanate (AUGMENTIN) 875-125 MG tablet, Take 1 tablet by mouth every 12 (twelve) hours., Disp: 14 tablet, Rfl: 0   buPROPion (WELLBUTRIN XL) 150 MG 24 hr tablet, Take 150 mg by mouth daily., Disp: , Rfl:    capsicum (ZOSTRIX) 0.075 % topical cream, Apply 1 application topically 2 (two) times daily as needed., Disp: 28.3 g, Rfl: 0   celecoxib (CELEBREX) 200 MG capsule, Take 1 capsule (200 mg total) by mouth 2 (two) times daily. (Patient not taking: Reported on 08/31/2020), Disp: 60 capsule, Rfl: 0   clobetasol ointment (TEMOVATE) 0.05 %, Apply 1 application topically 2 (two) times daily., Disp: 60 g, Rfl: 0   cyclobenzaprine (FLEXERIL) 10 MG tablet, Take 1 tablet (10 mg total) by mouth 3 (three) times daily as needed for muscle spasms., Disp: 15 tablet, Rfl: 0   dexlansoprazole (DEXILANT) 60 MG capsule, Take 60 mg by mouth daily., Disp: , Rfl:    diclofenac Sodium (VOLTAREN) 1 % GEL, Apply 2 g topically 4 (four) times daily., Disp: , Rfl:    gabapentin (NEURONTIN) 100 MG capsule, Take 1 capsule (100 mg total) by mouth 3 (three) times daily as needed., Disp: 30 capsule, Rfl: 0   hydrochlorothiazide (HYDRODIURIL) 25 MG tablet, Take 25 mg by mouth daily., Disp: , Rfl:    hydrOXYzine (ATARAX/VISTARIL) 50 MG  tablet, Take 50 mg by mouth 2 (two) times daily as needed for anxiety or itching. , Disp: , Rfl: 1   lidocaine (XYLOCAINE) 2 % solution, Use as directed 15 mLs in the mouth or throat as needed for mouth pain., Disp: 100 mL, Rfl: 0   meloxicam (MOBIC) 7.5 MG tablet, Take 1 tablet (7.5 mg total) by mouth daily., Disp: 30 tablet, Rfl: 0   Multiple Vitamins-Minerals (HAIR SKIN AND NAILS FORMULA PO), Take 1 tablet by mouth daily., Disp: , Rfl:    naproxen (NAPROSYN) 500 MG tablet, Take 1 tablet (500 mg total) by mouth 2 (two) times daily as needed for mild pain, moderate pain or headache (TAKE WITH MEALS.). (Patient not taking: Reported on 08/31/2020), Disp: 20 tablet, Rfl: 0   ondansetron (ZOFRAN-ODT) 4 MG disintegrating tablet, Take 1 tablet (4 mg total) by mouth every 8 (eight) hours as needed for nausea or vomiting., Disp: 15 tablet, Rfl: 0   oxyCODONE-acetaminophen (PERCOCET/ROXICET) 5-325 MG tablet, Take 1 tablet by mouth every 6 (six) hours as needed for severe pain., Disp: 15 tablet, Rfl: 0   predniSONE (DELTASONE) 20 MG tablet, Take 2 tablets (40 mg total) by mouth daily with breakfast., Disp: 10 tablet, Rfl: 0   promethazine (PHENERGAN) 12.5 MG tablet, Take 12.5 mg by mouth every 6 (six) hours as needed for nausea or vomiting., Disp: , Rfl:    traZODone (DESYREL) 100 MG tablet, Take 100 mg by mouth at bedtime.  , Disp: , Rfl:    triamcinolone ointment (  KENALOG) 0.1 %, Apply 1 application topically daily., Disp: , Rfl:    Allergies  Allergen Reactions   Doxycycline Other (See Comments)    Stomach pain.   Hydromorphone Hcl Other (See Comments)    Stomach pain.   Ibuprofen     REACTION: GI upset   Iohexol      Desc: PATIENT STATES ALLERGY TO IV CONTRAST, SHE INDICATED SEVERE ONSET OF ABDOMINAL PAIN.  I EXPLAINED THIS MAY NOT BE AN ACTUAL ALLERGY, BUT SHE DISAGREED.  NO IV CONTRAST GIVEN TODAY, 09-23-07. LW, Onset Date: [id number]    Morphine Sulfate Other (See Comments)    Stomach pain.    Sulfamethoxazole-Trimethoprim Other (See Comments)    Stomach pain.   Contrast Media [Iodinated Diagnostic Agents] Rash    Past Medical History:  Diagnosis Date   Acid reflux    Arthritis    Depression    Fibromyalgia    Hypertension    IBS (irritable bowel syndrome)    Shingles      Past Surgical History:  Procedure Laterality Date   CHOLECYSTECTOMY     CYSTECTOMY Left 1996   on the neck    TUBAL LIGATION      History reviewed. No pertinent family history.  Social History   Tobacco Use   Smoking status: Never   Smokeless tobacco: Never  Substance Use Topics   Alcohol use: Yes   Drug use: No    ROS   Objective:   Vitals: BP (!) 189/115   Pulse 67   Temp 97.9 F (36.6 C)   Resp 18   LMP 06/28/2010   SpO2 97%   BP Readings from Last 3 Encounters:  09/05/21 (!) 189/115  07/02/21 (!) 208/98  06/26/21 (!) 167/89   Physical Exam Constitutional:      General: She is not in acute distress.    Appearance: Normal appearance. She is well-developed. She is not ill-appearing, toxic-appearing or diaphoretic.  HENT:     Head: Normocephalic and atraumatic.     Nose: Nose normal.     Mouth/Throat:     Mouth: Mucous membranes are moist.     Pharynx: Oropharynx is clear.  Eyes:     General: No scleral icterus.    Extraocular Movements: Extraocular movements intact.     Pupils: Pupils are equal, round, and reactive to light.  Cardiovascular:     Rate and Rhythm: Normal rate and regular rhythm.     Pulses: Normal pulses.     Heart sounds: Normal heart sounds. No murmur heard.   No friction rub. No gallop.  Pulmonary:     Effort: Pulmonary effort is normal. No respiratory distress.     Breath sounds: Normal breath sounds. No stridor. No wheezing, rhonchi or rales.  Chest:     Chest wall: Tenderness (right thoracic lateral wall, right ribs mid thorax laterally) present.  Skin:    General: Skin is warm and dry.     Findings: No rash.  Neurological:      General: No focal deficit present.     Mental Status: She is alert and oriented to person, place, and time.     Cranial Nerves: No cranial nerve deficit or facial asymmetry.     Motor: No weakness or pronator drift.     Coordination: Romberg sign negative. Coordination normal.     Gait: Gait normal.  Psychiatric:        Mood and Affect: Mood normal.        Behavior:  Behavior normal.        Thought Content: Thought content normal.    DG Ribs Unilateral W/Chest Right  Result Date: 09/05/2021 CLINICAL DATA:  RIGHT rib pain since Friday night EXAM: RIGHT RIBS AND CHEST - 3+ VIEW COMPARISON:  Chest radiographs 02/03/2015 FINDINGS: Normal heart size, mediastinal contours, and pulmonary vascularity. Lungs clear. No pulmonary infiltrate, pleural effusion, or pneumothorax. RIGHT upper quadrant surgical clips question cholecystectomy. Osseous mineralization normal. No rib fracture or bone destruction. IMPRESSION: No acute abnormalities. Electronically Signed   By: Lavonia Dana M.D.   On: 09/05/2021 18:14     Assessment and Plan :   PDMP not reviewed this encounter.  1. Acute right-sided thoracic back pain   2. Right-sided chest wall pain   3. History of shingles   4. Essential hypertension   5. Hypertensive urgency    Recommended conservative management with Tylenol, tizanidine, tramadol for chest wall pain, right-sided superficial thoracic musculoskeletal pain.  No signs of acute encephalopathy, stroke on exam.  Emphasized the need to recheck with her regular doctor about the use of Wellbutrin as this could affect her blood pressure.  Discussed hypertensive friendly diet.  Needs to follow-up with her PCP as soon as possible. Counseled patient on potential for adverse effects with medications prescribed/recommended today, ER and return-to-clinic precautions discussed, patient verbalized understanding.    Jaynee Eagles, [place] 09/08/21 [phone]

## 2021-09-05 NOTE — ED Triage Notes (Signed)
Pt reports pain in RT side on Friday night. Pt has tried OTC with out relief.

## 2021-11-04 ENCOUNTER — Other Ambulatory Visit: Payer: Self-pay

## 2021-11-04 ENCOUNTER — Emergency Department (HOSPITAL_COMMUNITY): Payer: Medicare Other

## 2021-11-04 ENCOUNTER — Emergency Department (HOSPITAL_COMMUNITY)
Admission: EM | Admit: 2021-11-04 | Discharge: 2021-11-04 | Disposition: A | Discharge status: Home / Self Care | Payer: Medicare Other | Attending: Emergency Medicine | Admitting: Emergency Medicine

## 2021-11-04 DIAGNOSIS — Z79899 Other long term (current) drug therapy: Secondary | ICD-10-CM | POA: Diagnosis not present

## 2021-11-04 DIAGNOSIS — R0789 Other chest pain: Secondary | ICD-10-CM | POA: Diagnosis not present

## 2021-11-04 DIAGNOSIS — M549 Dorsalgia, unspecified: Secondary | ICD-10-CM | POA: Insufficient documentation

## 2021-11-04 DIAGNOSIS — I159 Secondary hypertension, unspecified: Secondary | ICD-10-CM | POA: Diagnosis not present

## 2021-11-04 DIAGNOSIS — B029 Zoster without complications: Secondary | ICD-10-CM | POA: Diagnosis not present

## 2021-11-04 DIAGNOSIS — I10 Essential (primary) hypertension: Secondary | ICD-10-CM | POA: Diagnosis not present

## 2021-11-04 LAB — URINALYSIS, ROUTINE W REFLEX MICROSCOPIC
Bilirubin Urine: NEGATIVE
Glucose, UA: NEGATIVE mg/dL
Ketones, ur: NEGATIVE mg/dL
Leukocytes,Ua: NEGATIVE
Nitrite: NEGATIVE
Protein, ur: NEGATIVE mg/dL
Specific Gravity, Urine: 1.009 (ref 1.005–1.030)
pH: 7 (ref 5.0–8.0)

## 2021-11-04 MED ORDER — HYDROCODONE-ACETAMINOPHEN 5-325 MG PO TABS: 1.0000 | ORAL_TABLET | ORAL | 0 refills | Status: DC | PRN

## 2021-11-04 MED ORDER — HYDROCODONE-ACETAMINOPHEN 5-325 MG PO TABS
1.0000 | ORAL_TABLET | Freq: Once | ORAL | Status: AC
  Administered 2021-11-04: 1 via ORAL
  Filled 2021-11-04: qty 1

## 2021-11-04 MED ORDER — ONDANSETRON HCL 8 MG PO TABS: 8.0000 mg | ORAL_TABLET | Freq: Three times a day (TID) | ORAL | 0 refills | Status: DC | PRN

## 2021-11-04 MED ORDER — ONDANSETRON 4 MG PO TBDP
8.0000 mg | ORAL_TABLET | Freq: Once | ORAL | Status: AC
  Administered 2021-11-04: 8 mg via ORAL
  Filled 2021-11-04: qty 2

## 2021-11-04 MED ORDER — VALACYCLOVIR HCL 1 G PO TABS: 1000.0000 mg | ORAL_TABLET | Freq: Three times a day (TID) | ORAL | 0 refills | Status: DC

## 2021-11-04 MED ORDER — VALACYCLOVIR HCL 500 MG PO TABS
1000.0000 mg | ORAL_TABLET | Freq: Once | ORAL | Status: AC
  Administered 2021-11-04: 1000 mg via ORAL
  Filled 2021-11-04 (×2): qty 2

## 2021-11-04 NOTE — ED Triage Notes (Signed)
Pt. Stated, I got up this morning my back started hurting in the middle and it goes everywhere. It seems like its getting worse and worse.

## 2021-11-04 NOTE — Discharge Instructions (Addendum)
It appears that you have shingles on the left side of your chest and upper abdomen.  We are treating you with Valtrex to help prevent that from getting worse.  We also giving you a narcotic pain reliever to help with the pain and ondansetron, to help with the nausea and vomiting.  Follow-up with your doctor in a week or so to make sure you are getting better and to have your blood pressure rechecked.

## 2021-11-04 NOTE — ED Provider Notes (Signed)
[facility]   CSN: [id number] Arrival date & time: 11/04/21  0710     History  Chief Complaint  Patient presents with   Back Pain    Shannon Mayo is a 56 y.o. female.  HPI She presents for evaluation of atraumatic back pain, which started this morning.  She did not try to take anything for it.  She has never had this before and denies any known trauma.  She was able to drive her vehicle here.  She notices pain when she takes a deep breath or moves her left arm in the left anterior and posterior chest.    Home Medications Prior to Admission medications   Medication Sig Start Date End Date Taking? Authorizing Provider  HYDROcodone-acetaminophen (NORCO) 5-325 MG tablet Take 1 tablet by mouth every 4 (four) hours as needed for moderate pain or severe pain. 11/04/21  Yes Mancel BaleWentz, Josehua Hammar, MD  ondansetron (ZOFRAN) 8 MG tablet Take 1 tablet (8 mg total) by mouth every 8 (eight) hours as needed for nausea or vomiting. 11/04/21  Yes Mancel BaleWentz, Abas Leicht, MD  valACYclovir (VALTREX) 1000 MG tablet Take 1 tablet (1,000 mg total) by mouth 3 (three) times daily. 11/04/21  Yes Mancel BaleWentz, Taria Castrillo, MD  amoxicillin-clavulanate (AUGMENTIN) 875-125 MG tablet Take 1 tablet by mouth every 12 (twelve) hours. 07/02/21   Rhys MartiniGraham, Laura E, PA-C  buPROPion (WELLBUTRIN XL) 150 MG 24 hr tablet Take 150 mg by mouth daily.    [provider]  capsicum (ZOSTRIX) 0.075 % topical cream Apply 1 application topically 2 (two) times daily as needed. 12/29/20   Mardella LaymanHagler, Brian, MD  celecoxib (CELEBREX) 200 MG capsule Take 1 capsule (200 mg total) by mouth 2 (two) times daily. Patient not taking: Reported on 08/31/2020 10/06/11   Moreno-Coll, Adlih, MD  clobetasol ointment (TEMOVATE) 0.05 % Apply 1 application topically 2 (two) times daily. 12/15/20   Particia NearingLane, Rachel Elizabeth, PA-C  cyclobenzaprine (FLEXERIL) 10 MG tablet Take 1 tablet (10 mg total) by mouth 3 (three) times  daily as needed for muscle spasms. 04/02/18   Street, PollockMercedes, PA-C  dexlansoprazole (DEXILANT) 60 MG capsule Take 60 mg by mouth daily.    [provider]  diclofenac Sodium (VOLTAREN) 1 % GEL Apply 2 g topically 4 (four) times daily.    [provider]  gabapentin (NEURONTIN) 100 MG capsule Take 1 capsule (100 mg total) by mouth 3 (three) times daily as needed. 08/31/20   Sabas SousBero, Michael M, MD  hydrochlorothiazide (HYDRODIURIL) 25 MG tablet Take 25 mg by mouth daily.    [provider]  hydrOXYzine (ATARAX/VISTARIL) 50 MG tablet Take 50 mg by mouth 2 (two) times daily as needed for anxiety or itching.  01/25/17   [provider]  lidocaine (XYLOCAINE) 2 % solution Use as directed 15 mLs in the mouth or throat as needed for mouth pain. 07/02/21   Rhys MartiniGraham, Laura E, PA-C  meloxicam (MOBIC) 7.5 MG tablet Take 1 tablet (7.5 mg total) by mouth daily. 06/26/21   Wallis BambergMani, Mario, PA-C  Multiple Vitamins-Minerals (HAIR SKIN AND NAILS FORMULA PO) Take 1 tablet by mouth daily.    [provider]  naproxen (NAPROSYN) 500 MG tablet Take 1 tablet (500 mg total) by mouth 2 (two) times daily as needed for mild pain, moderate pain or headache (TAKE WITH MEALS.). Patient not taking: Reported on 08/31/2020 04/02/18   Street, Baxter SpringsMercedes, PA-C  predniSONE (DELTASONE) 20 MG tablet Take 2 tablets (40 mg total) by  mouth daily with breakfast. 12/15/20   Particia Nearing, PA-C  promethazine (PHENERGAN) 12.5 MG tablet Take 12.5 mg by mouth every 6 (six) hours as needed for nausea or vomiting.    [provider]  tiZANidine (ZANAFLEX) 4 MG tablet Take 1 tablet (4 mg total) by mouth at bedtime. 09/05/21   Wallis Bamberg, PA-C  traZODone (DESYREL) 100 MG tablet Take 100 mg by mouth at bedtime.      [provider]  triamcinolone ointment (KENALOG) 0.1 % Apply 1 application topically daily. 02/06/17   [provider]      Allergies    Doxycycline, Hydromorphone hcl,  Ibuprofen, Iohexol, Morphine sulfate, Sulfamethoxazole-trimethoprim, and Contrast media [iodinated contrast media]    Review of Systems   Review of Systems  All other systems reviewed and are negative.  Physical Exam Updated Vital Signs BP (!) 159/99    Pulse (!) 58    Temp 98.2 F (36.8 C) (Oral)    Resp 18    LMP 06/28/2010    SpO2 100%  Physical Exam Vitals and nursing note reviewed.  Constitutional:      General: She is in acute distress (Uncomfortable).     Appearance: She is well-developed. She is not ill-appearing.  HENT:     Head: Normocephalic and atraumatic.     Right Ear: External ear normal.     Left Ear: External ear normal.  Eyes:     Conjunctiva/sclera: Conjunctivae normal.     Pupils: Pupils are equal, round, and reactive to light.  Neck:     Trachea: Phonation normal.  Cardiovascular:     Rate and Rhythm: Normal rate and regular rhythm.     Heart sounds: Normal heart sounds.  Pulmonary:     Effort: Pulmonary effort is normal. No respiratory distress.     Breath sounds: Normal breath sounds. No stridor.  Chest:     Chest wall: Tenderness (Left anterior and posterior, mild) present.  Abdominal:     General: There is no distension.     Palpations: Abdomen is soft.     Tenderness: There is no abdominal tenderness.  Musculoskeletal:        General: Normal range of motion.     Cervical back: Normal range of motion and neck supple.  Skin:    General: Skin is warm and dry.  Neurological:     Mental Status: She is alert and oriented to person, place, and time.     Cranial Nerves: No cranial nerve deficit.     Sensory: No sensory deficit.     Motor: No abnormal muscle tone.     Coordination: Coordination normal.  Psychiatric:        Mood and Affect: Mood normal.        Behavior: Behavior normal.        Thought Content: Thought content normal.        Judgment: Judgment normal.    ED Results / Procedures / Treatments   Labs (all labs ordered are listed,  but only abnormal results are displayed) Labs Reviewed  URINALYSIS, ROUTINE W REFLEX MICROSCOPIC - Abnormal; Notable for the following components:      Result Value   Hgb urine dipstick SMALL (*)    Bacteria, UA RARE (*)    All other components within normal limits    EKG EKG Interpretation  Date/Time:  Thursday November 04 2021 12:08:19 EST Ventricular Rate:  64 PR Interval:  148 QRS Duration: 86 QT Interval:  422  QTC Calculation: 435 R Axis:   9 Text Interpretation: Normal sinus rhythm Normal ECG When compared with ECG of 31-Aug-2020 12:28, PREVIOUS ECG IS PRESENT No significant change was found Confirmed by Mancel Bale [id number]) on 11/04/2021 12:18:37 PM  Radiology DG Chest 2 View  Result Date: 11/04/2021 CLINICAL DATA:  Pain EXAM: CHEST - 2 VIEW COMPARISON:  November 2022 FINDINGS: The heart size and mediastinal contours are within normal limits. Both lungs are clear. No pleural effusion or pneumothorax. The visualized skeletal structures are unremarkable. IMPRESSION: No acute process in the chest. Electronically Signed   By: Guadlupe Spanish M.D.   On: 11/04/2021 12:08    Procedures Procedures    Medications Ordered in ED Medications  HYDROcodone-acetaminophen (NORCO/VICODIN) 5-325 MG per tablet 1 tablet (1 tablet Oral Given 11/04/21 1149)  valACYclovir (VALTREX) tablet 1,000 mg (1,000 mg Oral Given 11/04/21 1158)  ondansetron (ZOFRAN-ODT) disintegrating tablet 8 mg (8 mg Oral Given 11/04/21 1236)    ED Course/ Medical Decision Making/ A&P                           Medical Decision Making Presenting for evaluation of atraumatic chest discomfort.  She states that she had similar pain previously when she had shingles of the back and left abdomen.  She is ambulatory and drove her own vehicle.  There are no other specific symptoms.  Problems Addressed: Chest wall pain: self-limited or minor problem    Details: Atraumatic pain without systemic symptoms or respiratory and  cardiac complaints  Amount and/or Complexity of Data Reviewed Labs: ordered. Decision-making details documented in ED Course.    Details: Urinalysis-no evidence for infection Radiology: ordered and independent interpretation performed.    Details: Chest x-ray-no infiltrate or edema ECG/medicine tests: ordered and independent interpretation performed.    Details: Old EKG reviewed, EKG today, no change from prior.  Risk Prescription drug management.           Final Clinical Impression(s) / ED Diagnoses Final diagnoses:  Chest wall pain  Herpes zoster without complication  Secondary hypertension    Rx / DC Orders ED Discharge Orders          Ordered    valACYclovir (VALTREX) 1000 MG tablet  3 times daily        11/04/21 1342    ondansetron (ZOFRAN) 8 MG tablet  Every 8 hours PRN        11/04/21 1342    HYDROcodone-acetaminophen (NORCO) 5-325 MG tablet  Every 4 hours PRN        11/04/21 1342              Mancel Bale, MD 11/04/21 1455

## 2021-12-21 ENCOUNTER — Other Ambulatory Visit: Payer: Self-pay

## 2021-12-21 ENCOUNTER — Ambulatory Visit
Admission: EM | Admit: 2021-12-21 | Discharge: 2021-12-21 | Disposition: A | Discharge status: Home / Self Care | Payer: Medicare Other | Attending: Internal Medicine | Admitting: Internal Medicine

## 2021-12-21 DIAGNOSIS — I10 Essential (primary) hypertension: Secondary | ICD-10-CM

## 2021-12-21 DIAGNOSIS — R21 Rash and other nonspecific skin eruption: Secondary | ICD-10-CM

## 2021-12-21 MED ORDER — AMLODIPINE BESYLATE 5 MG PO TABS: 5.0000 mg | ORAL_TABLET | Freq: Every day | ORAL | 0 refills | Status: DC

## 2021-12-21 MED ORDER — FLUCONAZOLE 200 MG PO TABS: 200.0000 mg | ORAL_TABLET | Freq: Every day | ORAL | 0 refills | Status: AC

## 2021-12-21 MED ORDER — CLOTRIMAZOLE-BETAMETHASONE 1-0.05 % EX CREA: TOPICAL_CREAM | CUTANEOUS | 0 refills | Status: DC

## 2021-12-21 NOTE — ED Provider Notes (Signed)
?[facility] ? ? ? ?CSN: [id number] ?Arrival date & time: 12/21/21  1003 ? ? ?  ? ?History   ?Chief Complaint ?Chief Complaint  ?Patient presents with  ? Rash  ? ? ?HPI ?Shannon Mayo is a 56 y.o. female.  She presents today with bumpy rash that appeared yesterday on both sides of her abdomen just below the waistline.  It seemed to spread all day, but has remained confined to the lower abdomen and low back area.  The rash does not itch or hurt.  She put some capsaicin ointment on it, which made it burn.   ? ?She tends to take tub baths, does not recall sitting in a hot tub.   ? ?No fever, no headache, no respiratory symptoms, no vomiting or diarrhea.  Feels fine otherwise. ? ?Also noted to have markedly elevated blood pressure, which has been noted in the past.  Took a couple of Dyazide this morning, but it is not certain that she is taking this regularly.  She says she is trying to move up a primary care provider appointment from a date in April.  No headache, no weakness/clumsiness of arms/legs, no speech/vision difficulty, no edema, no chest discomfort/breathlessness. ? ? ?Rash ? ?Past Medical History:  ?Diagnosis Date  ? Acid reflux   ? Arthritis   ? Depression   ? Fibromyalgia   ? Hypertension   ? IBS (irritable bowel syndrome)   ? Shingles   ? ? ?Patient Active Problem List  ? Diagnosis Date Noted  ? Major depressive disorder, recurrent episode, moderate with anxious distress (HCC) 04/18/2020  ? PRURITUS 09/22/2008  ? KNEE PAIN, BILATERAL 09/22/2008  ? ROTATOR CUFF SYNDROME, LEFT 09/22/2008  ? NONSPECIFIC ABNORMAL RESULTS LIVR FUNCTION STUDY 05/08/2008  ? BOILS, RECURRENT 05/06/2008  ? HYPERMETROPIA 03/10/2008  ? NASAL POLYP 03/10/2008  ? HYPERTENSION 11/16/2007  ? PERIMENOPAUSAL STATUS 07/05/2007  ? SHOULDER PAIN, RIGHT, CHRONIC 07/05/2007  ? AMENORRHEA, SECONDARY 06/18/2007  ? FIBROMYALGIA 03/19/2007  ? METHICILLIN RESISTANT STAPHYLOCOCCUS AUREUS INFECTION 12/01/2006  ? ANXIETY 12/01/2006   ? DISORDER, DEPRESSIVE NEC 12/01/2006  ? MIGRAINE HEADACHE 12/01/2006  ? ALLERGIC RHINITIS, SEASONAL 12/01/2006  ? GERD 12/01/2006  ? GASTRITIS 12/01/2006  ? IRRITABLE BOWEL SYNDROME 12/01/2006  ? ? ?Past Surgical History:  ?Procedure Laterality Date  ? CHOLECYSTECTOMY    ? CYSTECTOMY Left 1996  ? on the neck   ? TUBAL LIGATION    ? ? ?Home Medications   ? ?Prior to Admission medications   ?Medication Sig Start Date End Date Taking? Authorizing Provider  ?amLODipine (NORVASC) 5 MG tablet Take 1 tablet (5 mg total) by mouth daily. 12/21/21 01/20/22 Yes Isa Rankin, MD  ?clotrimazole-betamethasone Thurmond Butts) cream Apply to affected area 2 times daily prn 12/21/21  Yes Isa Rankin, MD  ?cyclobenzaprine (FLEXERIL) 10 MG tablet Take 1 tablet (10 mg total) by mouth 3 (three) times daily as needed for muscle spasms. 04/02/18  Yes Street, [place], PA-C  ?dexlansoprazole (DEXILANT) 60 MG capsule Take 60 mg by mouth daily.   Yes [provider]  ?fluconazole (DIFLUCAN) 200 MG tablet Take 1 tablet (200 mg total) by mouth daily for 7 days. 12/21/21 12/28/21 Yes Isa Rankin, MD  ?hydrochlorothiazide (HYDRODIURIL) 25 MG tablet Take 25 mg by mouth daily.   Yes [provider]  ?tiZANidine (ZANAFLEX) 4 MG tablet Take 1 tablet (4 mg total) by mouth at bedtime. 09/05/21  Yes Wallis Bamberg, PA-C  ? ? ?Family History ?History reviewed.  No pertinent family history. ? ?Social History ?Social History  ? ?Tobacco Use  ? Smoking status: Never  ? Smokeless tobacco: Never  ?Vaping Use  ? Vaping Use: Never used  ?Substance Use Topics  ? Alcohol use: Not Currently  ? Drug use: No  ? ? ? ?Allergies   ?Doxycycline, Hydromorphone hcl, Ibuprofen, Iohexol, Morphine sulfate, Sulfamethoxazole-trimethoprim, and Contrast media [iodinated contrast media] ? ? ?Review of Systems ?Review of Systems  ?Skin:  Positive for rash.  See HPI ? ?Physical Exam ?Triage Vital Signs ?ED Triage Vitals  ?Enc Vitals Group  ?   BP  12/21/21 1052 (!) 222/118  ?   Pulse Rate 12/21/21 1052 62  ?   Resp 12/21/21 1052 18  ?   Temp 12/21/21 1052 98.6 ?F (37 ?C)  ?   Temp Source 12/21/21 1052 Oral  ?   SpO2 12/21/21 1052 100 %  ?   Weight 12/21/21 1049 152 lb (68.9 kg)  ?   Height 12/21/21 1049 5\' 4"  (1.626 m)  ?   Pain Score 12/21/21 1048 0  ?   Pain Loc --   ? ?Updated Vital Signs ?BP (S) (!) 207/115 (BP Location: Left Arm) Comment: MD at bedside  Pulse 65   Temp 98.6 ?F (37 ?C) (Oral)   Resp 16   Ht 5\' 4"  (1.626 m)   Wt 68.9 kg   LMP 06/28/2010   SpO2 100%   BMI 26.09 kg/m?  ? ?Physical Exam ?Constitutional:   ?   General: She is not in acute distress. ?   Appearance: She is not ill-appearing or toxic-appearing.  ?   Comments: Good hygiene  ?HENT:  ?   Head: Atraumatic.  ?   Mouth/Throat:  ?   Mouth: Mucous membranes are moist.  ?Eyes:  ?   Conjunctiva/sclera:  ?   Right eye: Right conjunctiva is not injected. No exudate. ?   Left eye: Left conjunctiva is not injected. No exudate. ?   Comments: Conjugate gaze observed  ?Cardiovascular:  ?   Rate and Rhythm: Normal rate.  ?Pulmonary:  ?   Effort: Pulmonary effort is normal. No respiratory distress.  ?Abdominal:  ?   General: There is no distension.  ?Musculoskeletal:  ?   Cervical back: Neck supple.  ?   Comments: Walked into the urgent care independently; gait unremarkable  ?Skin: ?   General: Skin is warm and dry.  ? ?    ?   Comments: No cyanosis.  See photo of rash, location as marked in diagram.   ?Mildly inflamed/excoriated appearing, small flesh colored papules without umbilication.  Do not appear to be vesicular.  Not tender or itchy per pt.  ?Neurological:  ?   Mental Status: She is alert.  ?   Comments: Face symmetric; speech clear/coherent/logical  ? ? ? ? ? ?UC Treatments / Results  ?Labs ?(all labs ordered are listed, but only abnormal results are displayed) ?Labs Reviewed - No data to display ?NA ? ?EKG ?NA ? ?Radiology ?No results found. ?NA ? ?Procedures ?Procedures  (including critical care time) ?NA ? ?Medications Ordered in UC ?Medications - No data to display ?NA ? ?Initial Impression / Assessment and Plan / UC Course  ? ?Differential diagnosis includes folliculitis (bacterial, fungal), eczema, contact dermatitis.  Because she takes baths and rash is not painful, will give trial of therapy directed at fungal folliculitis. ? ?Needs consistent anti hypertensive tx--gave rx for amlodipine and sent CRM request for assistance moving up  PCP appointment. ?Final Clinical Impressions(s) / UC Diagnoses  ? ?Final diagnoses:  ?Rash  ?Essential hypertension  ? ? ? ?Discharge Instructions   ? ?  ?Rash today may be due to a yeast called malassezia; prescriptions for fluconazole (pill for yeast infection) and clotrimazole/betamethasone (cream for yeast infection) were sent to the pharmacy.  Anticipate gradual improvement in rash over the next week or so.   ?Blood pressure was really high.  Prescription for amlodipine (blood pressure pill) was sent to the pharmacy.  Please take it every day.  We will try to get your primary care appointment moved earlier.  Please sign up for MyChart at the front desk when you leave today; it will make it easier for the team to reach you about things like appointments and medicines.   ? ? ? ? ?ED Prescriptions   ? ? Medication Sig Dispense Auth. Provider  ? fluconazole (DIFLUCAN) 200 MG tablet Take 1 tablet (200 mg total) by mouth daily for 7 days. 7 tablet Isa RankinMurray, Mayce Noyes Wilson, MD  ? clotrimazole-betamethasone Thurmond Butts(LOTRISONE) cream Apply to affected area 2 times daily prn 45 g Isa RankinMurray, Nhyira Leano Wilson, MD  ? amLODipine (NORVASC) 5 MG tablet Take 1 tablet (5 mg total) by mouth daily. 30 tablet Isa RankinMurray, Dameir Gentzler Wilson, MD  ? ?  ? ?PDMP not reviewed this encounter. ?  ?Isa RankinMurray, Nakyra Bourn Wilson, MD ?12/24/21 1415 ? ?

## 2021-12-21 NOTE — ED Triage Notes (Signed)
Pt c/o rash along both side of her abdomen to her back.  ? ?Pt states that there is a burning sensation inside her body with redness. Pt states that it does not itch.  ?

## 2021-12-21 NOTE — Discharge Instructions (Signed)
Rash today may be due to a yeast called malassezia; prescriptions for fluconazole (pill for yeast infection) and clotrimazole/betamethasone (cream for yeast infection) were sent to the pharmacy.  Anticipate gradual improvement in rash over the next week or so.   ?Blood pressure was really high.  Prescription for amlodipine (blood pressure pill) was sent to the pharmacy.  Please take it every day.  We will try to get your primary care appointment moved earlier.  Please sign up for MyChart at the front desk when you leave today; it will make it easier for the team to reach you about things like appointments and medicines.   ?

## 2022-02-01 ENCOUNTER — Encounter: Payer: Self-pay | Admitting: Family

## 2022-02-01 ENCOUNTER — Encounter (INDEPENDENT_AMBULATORY_CARE_PROVIDER_SITE_OTHER): Payer: Self-pay | Admitting: Family

## 2022-02-11 NOTE — Progress Notes (Signed)
This pt did not show up for video visit ?Was a no show ?

## 2022-05-24 ENCOUNTER — Encounter: Payer: Medicare Other | Admitting: Nurse Practitioner

## 2022-06-02 ENCOUNTER — Encounter: Payer: Medicare Other | Admitting: Nurse Practitioner

## 2022-08-20 ENCOUNTER — Encounter: Payer: Self-pay | Admitting: Emergency Medicine

## 2022-08-20 ENCOUNTER — Ambulatory Visit
Admission: EM | Admit: 2022-08-20 | Discharge: 2022-08-20 | Disposition: A | Discharge status: Home / Self Care | Payer: Medicare Other | Attending: Internal Medicine | Admitting: Internal Medicine

## 2022-08-20 DIAGNOSIS — T7840XA Allergy, unspecified, initial encounter: Secondary | ICD-10-CM | POA: Diagnosis present

## 2022-08-20 DIAGNOSIS — I10 Essential (primary) hypertension: Secondary | ICD-10-CM | POA: Insufficient documentation

## 2022-08-20 DIAGNOSIS — Z8679 Personal history of other diseases of the circulatory system: Secondary | ICD-10-CM

## 2022-08-20 LAB — COMPREHENSIVE METABOLIC PANEL
ALT: 20 U/L (ref 0–44)
AST: 24 U/L (ref 15–41)
Albumin: 4.2 g/dL (ref 3.5–5.0)
Alkaline Phosphatase: 97 U/L (ref 38–126)
Anion gap: 5 (ref 5–15)
BUN: 9 mg/dL (ref 6–20)
CO2: 30 mmol/L (ref 22–32)
Calcium: 9.3 mg/dL (ref 8.9–10.3)
Chloride: 107 mmol/L (ref 98–111)
Creatinine, Ser: 0.79 mg/dL (ref 0.44–1.00)
GFR, Estimated: >60 mL/min (ref >60)
Glucose, Bld: 99 mg/dL (ref 70–99)
Potassium: 3.8 mmol/L (ref 3.5–5.1)
Sodium: 142 mmol/L (ref 135–145)
Total Bilirubin: 0.8 mg/dL (ref 0.3–1.2)
Total Protein: 8.1 g/dL (ref 6.5–8.1)

## 2022-08-20 LAB — URINALYSIS, ROUTINE W REFLEX MICROSCOPIC
Bilirubin Urine: NEGATIVE
Glucose, UA: NEGATIVE mg/dL
Hgb urine dipstick: NEGATIVE
Ketones, ur: NEGATIVE mg/dL
Leukocytes,Ua: NEGATIVE
Nitrite: NEGATIVE
Protein, ur: NEGATIVE mg/dL
Specific Gravity, Urine: 1.015 (ref 1.005–1.030)
pH: 7 (ref 5.0–8.0)

## 2022-08-20 LAB — CBC WITH DIFFERENTIAL/PLATELET
Abs Immature Granulocytes: 0.01 10*3/uL (ref 0.00–0.07)
Basophils Absolute: 0 10*3/uL (ref 0.0–0.1)
Basophils Relative: 1 %
Eosinophils Absolute: 0.4 10*3/uL (ref 0.0–0.5)
Eosinophils Relative: 11 %
HCT: 41.2 % (ref 36.0–46.0)
Hemoglobin: 13.3 g/dL (ref 12.0–15.0)
Immature Granulocytes: 0 %
Lymphocytes Relative: 29 %
Lymphs Abs: 1.2 10*3/uL (ref 0.7–4.0)
MCH: 31 pg (ref 26.0–34.0)
MCHC: 32.3 g/dL (ref 30.0–36.0)
MCV: 96 fL (ref 80.0–100.0)
Monocytes Absolute: 0.5 10*3/uL (ref 0.1–1.0)
Monocytes Relative: 13 %
Neutro Abs: 1.9 10*3/uL (ref 1.7–7.7)
Neutrophils Relative %: 46 %
Platelets: 246 10*3/uL (ref 150–400)
RBC: 4.29 MIL/uL (ref 3.87–5.11)
RDW: 14.2 % (ref 11.5–15.5)
WBC: 4.1 10*3/uL (ref 4.0–10.5)
nRBC: 0 % (ref 0.0–0.2)

## 2022-08-20 MED ORDER — TRIAMCINOLONE ACETONIDE 0.025 % EX OINT: 1.0000 | TOPICAL_OINTMENT | Freq: Two times a day (BID) | CUTANEOUS | 0 refills | Status: DC

## 2022-08-20 MED ORDER — LISINOPRIL 20 MG PO TABS: 20.0000 mg | ORAL_TABLET | Freq: Every day | ORAL | 0 refills | Status: AC

## 2022-08-20 NOTE — ED Triage Notes (Signed)
Pt c/o upper lip swelling. Started yesterday. She states she took benadryl yesterday and improved but feels it getting tighter today.

## 2022-08-20 NOTE — ED Provider Notes (Signed)
[facility]    CSN: [id number] Arrival date & time: 08/20/22  0815      History   Chief Complaint Chief Complaint  Patient presents with   Oral Swelling    HPI Shannon Mayo is a 56 y.o. female who presents with hx of onset of L upper lip swelling yesterday am when she woke up. She took benadryl and improved, but this am feels it getting swollen again. Denies use of anything new on her lips and denies pain or itching. She did noticed the skin feels rough on this side of the swelling.   2- Has hx of HTN and been off her med x 2 years due to carring for her mother who had breast cancer    Past Medical History:  Diagnosis Date   Acid reflux    Arthritis    Depression    Fibromyalgia    Hypertension    IBS (irritable bowel syndrome)    Shingles     Patient Active Problem List   Diagnosis Date Noted   Major depressive disorder, recurrent episode, moderate with anxious distress (HCC) 04/18/2020   PRURITUS 09/22/2008   KNEE PAIN, BILATERAL 09/22/2008   ROTATOR CUFF SYNDROME, LEFT 09/22/2008   NONSPECIFIC ABNORMAL RESULTS LIVR FUNCTION STUDY 05/08/2008   BOILS, RECURRENT 05/06/2008   HYPERMETROPIA 03/10/2008   NASAL POLYP 03/10/2008   HYPERTENSION 11/16/2007   PERIMENOPAUSAL STATUS 07/05/2007   SHOULDER PAIN, RIGHT, CHRONIC 07/05/2007   AMENORRHEA, SECONDARY 06/18/2007   FIBROMYALGIA 03/19/2007   METHICILLIN RESISTANT STAPHYLOCOCCUS AUREUS INFECTION 12/01/2006   ANXIETY 12/01/2006   DISORDER, DEPRESSIVE NEC 12/01/2006   MIGRAINE HEADACHE 12/01/2006   ALLERGIC RHINITIS, SEASONAL 12/01/2006   GERD 12/01/2006   GASTRITIS 12/01/2006   IRRITABLE BOWEL SYNDROME 12/01/2006    Past Surgical History:  Procedure Laterality Date   CHOLECYSTECTOMY     CYSTECTOMY Left 1996   on the neck    TUBAL LIGATION      OB History   No obstetric history on file.      Home Medications    Prior to Admission medications   Medication Sig Start Date End  Date Taking? Authorizing Provider  lisinopril (ZESTRIL) 20 MG tablet Take 1 tablet (20 mg total) by mouth daily. 08/20/22  Yes Rodriguez-Southworth, Nettie Elm, PA-C  triamcinolone (KENALOG) 0.025 % ointment Apply 1 Application topically 2 (two) times daily. Apply lightly on L upper lip rash bid  x 7 days 08/20/22  Yes Rodriguez-Southworth, Nettie Elm, PA-C  acetaminophen (TYLENOL) 650 MG CR tablet Take 650 mg by mouth as needed for pain.    [provider]    Family History History reviewed. No pertinent family history.  Social History Social History   Tobacco Use   Smoking status: Never   Smokeless tobacco: Never  Vaping Use   Vaping Use: Never used  Substance Use Topics   Alcohol use: Not Currently   Drug use: No     Allergies   Doxycycline, Hydromorphone hcl, Ibuprofen, Iohexol, Morphine sulfate, Sulfamethoxazole-trimethoprim, and Contrast media [iodinated contrast media]   Review of Systems  Constitutional: Negative for diaphoresis and unexpected weight change.  HENT: Negative for tinnitus.   Eyes: Negative for visual disturbance.  Respiratory: Negative for chest tightness and shortness of breath.  On occasion gets sharp pains under L mid rib provoked with deep breaths, then resolves.  Cardiovascular: Negative for chest pain, palpitations and leg swelling.  Gastrointestinal: Negative for constipation, diarrhea and nausea.  Endocrine: Negative for polydipsia, polyphagia and polyuria.  Genitourinary: Negative for dysuria and frequency.  Skin: Negative for rash and wound.  Neurological: Negative for dizziness, speech difficulty, weakness, numbness. Gets occasional HA's    Physical Exam Triage Vital Signs ED Triage Vitals  Enc Vitals Group     BP 08/20/22 0827 (!) 210/111     Pulse Rate 08/20/22 0827 65     Resp 08/20/22 0827 16     Temp 08/20/22 0827 97.9 F (36.6 C)     Temp Source 08/20/22 0827 Oral     SpO2 08/20/22 0827 98 %     Weight 08/20/22 0825 147 lb  0.8 oz (66.7 kg)     Height 08/20/22 0825 5\' 4"  (1.626 m)     Head Circumference --      Peak Flow --      Pain Score 08/20/22 0824 0     Pain Loc --      Pain Edu? --      Excl. in GC? --    No data found.  Updated Vital Signs BP (!) 214/99 (BP Location: Left Arm)   Pulse 65   Temp 97.9 F (36.6 C) (Oral)   Resp 16   Ht 5\' 4"  (1.626 m)   Wt 147 lb 0.8 oz (66.7 kg)   LMP 06/28/2010   SpO2 98%   BMI 25.24 kg/m   Visual Acuity Right Eye Distance:   Left Eye Distance:   Bilateral Distance:    Right Eye Near:   Left Eye Near:    Bilateral Near:     Physical Exam  Constitutional: She is oriented to person, place, and time. She appears well-developed and well-nourished. No distress.  HENT:  Head: Normocephalic and atraumatic.  Right Ear: External ear normal.  Left Ear: External ear normal.  Nose: Nose normal.  Mouth- L upper lip with mild swelling, and skin texture is rough and edematous. No lesions noted, and it is not painful.  Eyes: Conjunctivae are normal. Right eye exhibits no discharge. Left eye exhibits no discharge. No scleral icterus.  Neck: Neck supple. No thyromegaly present.  No carotid bruits bilaterally  Cardiovascular: Normal rate and regular rhythm.  No murmur heard. Pulmonary/Chest: Effort normal and breath sounds normal. No respiratory distress.  Musculoskeletal: Normal range of motion. She exhibits no edema.  Lymphadenopathy:    She has no cervical adenopathy.  Neurological: She is alert and oriented to person, place, and time.  Skin: Skin is warm and dry. Capillary refill takes less than 2 seconds. No rash noted. She is not diaphoretic.  Psychiatric: She has a normal mood and affect. Her behavior is normal. Judgment and thought content normal.  Nursing note reviewed.  EKG Sinus brady. When compared to 11/05/2021 seems unchanged except for lead 3 which has inverted T wave.  UC Treatments / Results  Labs (all labs ordered are listed, but only  abnormal results are displayed) Labs Reviewed  COMPREHENSIVE METABOLIC PANEL  CBC WITH DIFFERENTIAL/PLATELET  URINALYSIS, ROUTINE W REFLEX MICROSCOPIC  CBC, CMP and UA are normal   EKG Sinus brady. When compared to 11/05/2021 seems unchanged except for lead 3 which has inverted T wave.   Radiology No results found.  Procedures Procedures (including critical care time)  Medications Ordered in UC Medications - No data to display  Initial Impression / Assessment and Plan / UC Course  I have reviewed the triage vital signs and the nursing notes.  Pertinent labs & imaging results that were available during my care of the patient  were reviewed by me and considered in my medical decision making (see chart for details).  Local allergic reaction of L upper lip of unknown cause Uncontrolled HTN  I placed her back on Lisinopril 20 mg qd I also placed her on Triamcinolone as noted for her L upper lip  Advised to do BP diaries and if she is not called with new PCP in the next 2 weeks to come FU with me and bring her BP diaries.    Final Clinical Impressions(s) / UC Diagnoses  I placed her back on Lisinopril 20 mg qd I also placed her on Triamcinolone as noted for her L upper lip  Final diagnoses:  H/O essential hypertension  Uncontrolled hypertension  Allergic reaction, initial encounter     Discharge Instructions      Monitor your blood pressure and do diaries, needs to stay less than 140/90 If you dont get in to see a primary care doctor in the next 2 weeks, come see me on either of the following days for follow up 11/16 after 3 pm, or 11/20 any time.   Listen and check out this doctor's web site who is a breast cancer survivor Dr. Delorse Limber [place], [place] " Breast Cancer conqueror"     ED Prescriptions     Medication Sig Dispense Auth. Provider   lisinopril (ZESTRIL) 20 MG tablet Take 1 tablet (20 mg total) by mouth daily. 30 tablet Rodriguez-Southworth, Anesa Fronek, PA-C    triamcinolone (KENALOG) 0.025 % ointment Apply 1 Application topically 2 (two) times daily. Apply lightly on L upper lip rash bid  x 7 days 15 g Rodriguez-Southworth, Nettie Elm, PA-C      PDMP not reviewed this encounter.   Garey Ham, PA-C 08/20/22 1029    Rodriguez-Southworth, [place], PA-C 08/22/22 1049

## 2022-08-20 NOTE — Discharge Instructions (Addendum)
Monitor your blood pressure and do diaries, needs to stay less than 140/90 If you dont get in to see a primary care doctor in the next 2 weeks, come see me on either of the following days for follow up 11/16 after 3 pm, or 11/20 any time.   Listen and check out this doctor's web site who is a breast cancer survivor Dr. Delorse Limber [place], [place] " Breast Cancer conqueror"

## 2023-02-10 ENCOUNTER — Ambulatory Visit
Admission: EM | Admit: 2023-02-10 | Discharge: 2023-02-10 | Disposition: A | Discharge status: Home / Self Care | Payer: 59 | Attending: Family Medicine | Admitting: Family Medicine

## 2023-02-10 ENCOUNTER — Ambulatory Visit
Admission: RE | Admit: 2023-02-10 | Discharge: 2023-02-10 | Disposition: A | Discharge status: Home / Self Care | Payer: 59 | Source: Ambulatory Visit | Attending: Family Medicine | Admitting: Family Medicine

## 2023-02-10 DIAGNOSIS — M79661 Pain in right lower leg: Secondary | ICD-10-CM

## 2023-02-10 DIAGNOSIS — I824Z1 Acute embolism and thrombosis of unspecified deep veins of right distal lower extremity: Secondary | ICD-10-CM | POA: Diagnosis present

## 2023-02-10 DIAGNOSIS — I825Z1 Chronic embolism and thrombosis of unspecified deep veins of right distal lower extremity: Secondary | ICD-10-CM | POA: Insufficient documentation

## 2023-02-10 DIAGNOSIS — R21 Rash and other nonspecific skin eruption: Secondary | ICD-10-CM

## 2023-02-10 DIAGNOSIS — I10 Essential (primary) hypertension: Secondary | ICD-10-CM

## 2023-02-10 MED ORDER — TRIAMCINOLONE ACETONIDE 0.1 % EX OINT: 1.0000 | TOPICAL_OINTMENT | Freq: Two times a day (BID) | CUTANEOUS | 0 refills | Status: AC

## 2023-02-10 NOTE — Discharge Instructions (Addendum)
Follow up with [facility] for the itching rash. Call to schedule you an appointment [phone].   Go to the medical mall to have an ultrasound performed. Look for signs at [facility] for the [facility]. Enter through the medical mall and go to the radiology department for your ultrasound.

## 2023-02-10 NOTE — ED Provider Notes (Incomplete)
[facility]    CSN: [id number] Arrival date & time: 02/10/23  1336      History   Chief Complaint Chief Complaint  Patient presents with   Rash    HPI Shannon Mayo is a 57 y.o. female.   HPI  Shannon Mayo presents for neck rash that started after getting her hair cut 2 weeks ago.  She put neosporin and steroid cream that helped. The other day it started itching really bad not now the Neosporin isnt helping. Rash burns when the sun or heat hit the area it burns.   She was walking yesterday and started seeing veins popping up on her right lower leg. No similar sx previously. Pain is still present and hurts when she walking. No leg swelling. No history of blood clots in her legs or lungs. She does not take any blood thinners.    ***Redness, pain, swelling ***Treatments tried ***Previous symptoms ***Insect or bug bites  Fever : no Vision changes: No Sore throat: no   Shortness of breath: no Rhinorrhea: no Appetite: normal  Hydration: normal  Abdominal pain: no Nausea: no Vomiting: no Diarrhea: no Dysuria: no  Sleep disturbance: no Arthralgias: no Headache: no   Past Medical History:  Diagnosis Date   Acid reflux    Arthritis    Depression    Fibromyalgia    Hypertension    IBS (irritable bowel syndrome)    Shingles     Patient Active Problem List   Diagnosis Date Noted   Major depressive disorder, recurrent episode, moderate with anxious distress (HCC) 04/18/2020   PRURITUS 09/22/2008   KNEE PAIN, BILATERAL 09/22/2008   ROTATOR CUFF SYNDROME, LEFT 09/22/2008   NONSPECIFIC ABNORMAL RESULTS LIVR FUNCTION STUDY 05/08/2008   BOILS, RECURRENT 05/06/2008   HYPERMETROPIA 03/10/2008   NASAL POLYP 03/10/2008   HYPERTENSION 11/16/2007   PERIMENOPAUSAL STATUS 07/05/2007   SHOULDER PAIN, RIGHT, CHRONIC 07/05/2007   AMENORRHEA, SECONDARY 06/18/2007   FIBROMYALGIA 03/19/2007   METHICILLIN RESISTANT STAPHYLOCOCCUS AUREUS INFECTION 12/01/2006    ANXIETY 12/01/2006   DISORDER, DEPRESSIVE NEC 12/01/2006   MIGRAINE HEADACHE 12/01/2006   ALLERGIC RHINITIS, SEASONAL 12/01/2006   GERD 12/01/2006   GASTRITIS 12/01/2006   IRRITABLE BOWEL SYNDROME 12/01/2006    Past Surgical History:  Procedure Laterality Date   CHOLECYSTECTOMY     CYSTECTOMY Left 1996   on the neck    TUBAL LIGATION      OB History   No obstetric history on file.      Home Medications    Prior to Admission medications   Medication Sig Start Date End Date Taking? Authorizing Provider  acetaminophen (TYLENOL) 650 MG CR tablet Take 650 mg by mouth as needed for pain.    [provider]  lisinopril (ZESTRIL) 20 MG tablet Take 1 tablet (20 mg total) by mouth daily. 08/20/22   Rodriguez-Southworth, Nettie Elm, PA-C  triamcinolone (KENALOG) 0.025 % ointment Apply 1 Application topically 2 (two) times daily. Apply lightly on L upper lip rash bid  x 7 days 08/20/22   Rodriguez-Southworth, Nettie Elm, PA-C    Family History History reviewed. No pertinent family history.  Social History Social History   Tobacco Use   Smoking status: Never   Smokeless tobacco: Never  Vaping Use   Vaping Use: Never used  Substance Use Topics   Alcohol use: Not Currently   Drug use: No     Allergies   Doxycycline, Hydromorphone hcl, Ibuprofen, Iohexol, Morphine sulfate, Sulfamethoxazole-trimethoprim, and Contrast media [iodinated contrast media]  Review of Systems Review of Systems :negative unless otherwise stated in HPI.      Physical Exam Triage Vital Signs ED Triage Vitals [02/10/23 1341]  Enc Vitals Group     BP      Pulse      Resp 16     Temp      Temp Source Oral     SpO2      Weight      Height      Head Circumference      Peak Flow      Pain Score      Pain Loc      Pain Edu?      Excl. in GC?    No data found.  Updated Vital Signs Resp 16   LMP 06/28/2010   Visual Acuity Right Eye Distance:   Left Eye Distance:   Bilateral  Distance:    Right Eye Near:   Left Eye Near:    Bilateral Near:     Physical Exam  GEN: alert, well appearing female, in no acute distress *** EYES: extra occular movements intact, no scleral injection*** CV: regular rate ***and rhythm RESP: no increased work of breathing, ***clear to ascultation bilaterally MSK: no extremity edema, no gross deformities NEURO: alert, moves all extremities appropriately, normal gait PSYCH: Normal affect, appropriate speech and behavior  SKIN: warm and dry; ***   ***pic  UC Treatments / Results  Labs (all labs ordered are listed, but only abnormal results are displayed) Labs Reviewed - No data to display  EKG   Radiology No results found.  Procedures Procedures (including critical care time)  Medications Ordered in UC Medications - No data to display  Initial Impression / Assessment and Plan / UC Course  I have reviewed the triage vital signs and the nursing notes.  Pertinent labs & imaging results that were available during my care of the patient were reviewed by me and considered in my medical decision making (see chart for details).     Patient is a 57 y.o. femalewho presents for***.  Overall, patient is well-appearing and well-hydrated.  Vital signs stable.  Shannon Mayo is afebrile.  Exam concerning for ***.  Treat with***steroid ointment. ***No sign of infection to suggest antibiotics at this time.     Reviewed expectations regarding course of current medical issues.  All questions asked were answered.  Outlined signs and symptoms indicating need for more acute intervention. Patient verbalized understanding. After Visit Summary given.   Final Clinical Impressions(s) / UC Diagnoses   Final diagnoses:  None     Discharge Instructions      Follow up with [facility] for the itching rash. Call to schedule you an appointment [phone].    ED Prescriptions   None    PDMP not reviewed this  encounter.

## 2023-02-10 NOTE — ED Triage Notes (Signed)
Pt c/o rash in neck x2 wks states she got a haircut & rash developed after. She applied neosporin w/o relief. Also c/o swollen veins in R leg x1 day, states leg is painful. Denies any falls or injuries.

## 2023-02-20 ENCOUNTER — Ambulatory Visit
Admission: EM | Admit: 2023-02-20 | Discharge: 2023-02-20 | Disposition: A | Discharge status: Home / Self Care | Payer: 59 | Attending: Emergency Medicine | Admitting: Emergency Medicine

## 2023-02-20 DIAGNOSIS — M5441 Lumbago with sciatica, right side: Secondary | ICD-10-CM

## 2023-02-20 DIAGNOSIS — I10 Essential (primary) hypertension: Secondary | ICD-10-CM | POA: Diagnosis not present

## 2023-02-20 LAB — POCT URINALYSIS DIP (MANUAL ENTRY)
Bilirubin, UA: NEGATIVE
Blood, UA: NEGATIVE
Glucose, UA: NEGATIVE mg/dL
Ketones, POC UA: NEGATIVE mg/dL
Leukocytes, UA: NEGATIVE
Nitrite, UA: NEGATIVE
Protein Ur, POC: NEGATIVE mg/dL
Spec Grav, UA: 1.015 (ref 1.010–1.025)
Urobilinogen, UA: 0.2 E.U./dL
pH, UA: 6 (ref 5.0–8.0)

## 2023-02-20 MED ORDER — CYCLOBENZAPRINE HCL 5 MG PO TABS: 5.0000 mg | ORAL_TABLET | Freq: Two times a day (BID) | ORAL | 0 refills | Status: AC | PRN

## 2023-02-20 NOTE — Discharge Instructions (Addendum)
Take the cyclobenzaprine as directed.  Do not drive, operate machinery, drink alcohol, or perform dangerous activities while taking this medication as it may cause drowsiness.  Take Tylenol as needed for discomfort.   Your blood pressure is elevated today at 152/96.  Please have this rechecked by your primary care provider in 2-4 weeks.

## 2023-02-20 NOTE — ED Triage Notes (Signed)
Patient to Urgent Care with complaints of mid/ thoracic back pain that started yesterday when she woke up. Denies any known injury. Reports treating w/ tylenol and zanaflex. Using a heating pad and resting.   Reports feeling back spasms. Pain radiates into her right leg. Pain worse when standing/ with activity

## 2023-02-20 NOTE — ED Provider Notes (Signed)
Renaldo Fiddler    CSN: [id number] Arrival date & time: 02/20/23  0934      History   Chief Complaint Chief Complaint  Patient presents with   Back Pain    HPI Shannon Mayo is a 57 y.o. female.  Patient presents with 1 day history of right lower back pain which radiates down her right leg.  The pain came on gradually yesterday throughout the course of the day.  No falls or injury.  Treatment attempted with Tylenol, tizanidine, rest, heating pad.  The pain is worse with standing and ambulation; improves with rest.  She denies numbness, weakness, saddle anesthesia, loss of bowel/bladder control, abdominal pain, dysuria, or other symptoms.    Patient was seen at [facility] urgent care on 02/10/2023; diagnosed with rash, pain of right lower leg, elevated blood pressure reading with hypertension; treated with triamcinolone cream; negative ultrasound for DVT.    Her medical history includes arthritis, fibromyalgia, migraine headache, hypertension, IBS, depression.  The history is provided by the patient and medical records.    Past Medical History:  Diagnosis Date   Acid reflux    Arthritis    Depression    Fibromyalgia    Hypertension    IBS (irritable bowel syndrome)    Shingles     Patient Active Problem List   Diagnosis Date Noted   Major depressive disorder, recurrent episode, moderate with anxious distress (HCC) 04/18/2020   PRURITUS 09/22/2008   KNEE PAIN, BILATERAL 09/22/2008   ROTATOR CUFF SYNDROME, LEFT 09/22/2008   NONSPECIFIC ABNORMAL RESULTS LIVR FUNCTION STUDY 05/08/2008   BOILS, RECURRENT 05/06/2008   HYPERMETROPIA 03/10/2008   NASAL POLYP 03/10/2008   HYPERTENSION 11/16/2007   PERIMENOPAUSAL STATUS 07/05/2007   SHOULDER PAIN, RIGHT, CHRONIC 07/05/2007   AMENORRHEA, SECONDARY 06/18/2007   FIBROMYALGIA 03/19/2007   METHICILLIN RESISTANT STAPHYLOCOCCUS AUREUS INFECTION 12/01/2006   ANXIETY 12/01/2006   DISORDER, DEPRESSIVE NEC 12/01/2006   MIGRAINE  HEADACHE 12/01/2006   ALLERGIC RHINITIS, SEASONAL 12/01/2006   GERD 12/01/2006   GASTRITIS 12/01/2006   IRRITABLE BOWEL SYNDROME 12/01/2006    Past Surgical History:  Procedure Laterality Date   CHOLECYSTECTOMY     CYSTECTOMY Left 1996   on the neck    TUBAL LIGATION      OB History   No obstetric history on file.      Home Medications    Prior to Admission medications   Medication Sig Start Date End Date Taking? Authorizing Provider  amitriptyline (ELAVIL) 10 MG tablet Take by mouth. 09/16/22  Yes [provider]  buPROPion (WELLBUTRIN XL) 150 MG 24 hr tablet Take by mouth. 09/16/22  Yes [provider]  cyclobenzaprine (FLEXERIL) 5 MG tablet Take 1 tablet (5 mg total) by mouth 2 (two) times daily as needed for muscle spasms. 02/20/23  Yes Mickie Bail, NP  DULoxetine (CYMBALTA) 20 MG capsule Take 2 capsules by mouth daily. 09/16/22  Yes [provider]  olmesartan (BENICAR) 20 MG tablet Take 1 tablet by mouth daily. 02/08/23  Yes [provider]  propranolol (INDERAL) 20 MG tablet Take by mouth. 12/14/22  Yes [provider]  rizatriptan (MAXALT) 10 MG tablet Take by mouth. 12/14/22  Yes [provider]  acetaminophen (TYLENOL) 650 MG CR tablet Take 650 mg by mouth as needed for pain.    [provider]  dexlansoprazole (DEXILANT) 60 MG capsule Take 1 capsule by mouth daily.    [provider]  hydrochlorothiazide (HYDRODIURIL) 25 MG tablet Take  25 mg by mouth daily.    [provider]  hydrOXYzine (VISTARIL) 25 MG capsule Take 25 mg by mouth 3 (three) times daily as needed.    [provider]  lisinopril (ZESTRIL) 20 MG tablet Take 1 tablet (20 mg total) by mouth daily. 08/20/22   Rodriguez-Southworth, Nettie Elm, PA-C  triamcinolone ointment (KENALOG) 0.1 % Apply 1 Application topically 2 (two) times daily. 02/10/23   Katha Cabal, DO    Family History History reviewed. No pertinent family  history.  Social History Social History   Tobacco Use   Smoking status: Never   Smokeless tobacco: Never  Vaping Use   Vaping Use: Never used  Substance Use Topics   Alcohol use: Not Currently   Drug use: No     Allergies   Doxycycline, Hydromorphone hcl, Ibuprofen, Iohexol, Morphine sulfate, Sulfamethoxazole-trimethoprim, and Contrast media [iodinated contrast media]   Review of Systems Review of Systems  Constitutional:  Negative for chills and fever.  Respiratory:  Negative for cough and shortness of breath.   Cardiovascular:  Negative for chest pain and palpitations.  Gastrointestinal:  Negative for abdominal pain, constipation, diarrhea, nausea and vomiting.  Genitourinary:  Negative for dysuria and hematuria.  Musculoskeletal:  Positive for back pain and gait problem. Negative for joint swelling.  Skin:  Negative for color change, rash and wound.  Neurological:  Negative for weakness and numbness.     Physical Exam Triage Vital Signs ED Triage Vitals  Enc Vitals Group     BP 02/20/23 1107 (!) 152/96     Pulse Rate 02/20/23 1107 (!) 57     Resp 02/20/23 1107 18     Temp 02/20/23 1107 97.9 F (36.6 C)     Temp src --      SpO2 02/20/23 1107 98 %     Weight --      Height --      Head Circumference --      Peak Flow --      Pain Score 02/20/23 1104 9     Pain Loc --      Pain Edu? --      Excl. in GC? --    No data found.  Updated Vital Signs BP (!) 152/96   Pulse (!) 57   Temp 97.9 F (36.6 C)   Resp 18   LMP 06/28/2010   SpO2 98%   Visual Acuity Right Eye Distance:   Left Eye Distance:   Bilateral Distance:    Right Eye Near:   Left Eye Near:    Bilateral Near:     Physical Exam Vitals and nursing note reviewed.  Constitutional:      General: She is not in acute distress.    Appearance: She is well-developed. She is not ill-appearing.  HENT:     Mouth/Throat:     Mouth: Mucous membranes are moist.  Cardiovascular:     Rate and  Rhythm: Normal rate and regular rhythm.     Heart sounds: Normal heart sounds.  Pulmonary:     Effort: Pulmonary effort is normal. No respiratory distress.     Breath sounds: Normal breath sounds.  Abdominal:     General: Bowel sounds are normal.     Palpations: Abdomen is soft.     Tenderness: There is no abdominal tenderness. There is no right CVA tenderness, left CVA tenderness, guarding or rebound.  Musculoskeletal:        General: No swelling, tenderness, deformity or signs of injury.  Normal range of motion.     Cervical back: Neck supple.  Skin:    General: Skin is warm and dry.     Capillary Refill: Capillary refill takes less than 2 seconds.     Findings: No bruising, erythema, lesion or rash.  Neurological:     General: No focal deficit present.     Mental Status: She is alert and oriented to person, place, and time.     Sensory: No sensory deficit.     Motor: No weakness.     Gait: Gait normal.  Psychiatric:        Mood and Affect: Mood normal.        Behavior: Behavior normal.      UC Treatments / Results  Labs (all labs ordered are listed, but only abnormal results are displayed) Labs Reviewed  POCT URINALYSIS DIP (MANUAL ENTRY)    EKG   Radiology No results found.  Procedures Procedures (including critical care time)  Medications Ordered in UC Medications - No data to display  Initial Impression / Assessment and Plan / UC Course  I have reviewed the triage vital signs and the nursing notes.  Pertinent labs & imaging results that were available during my care of the patient were reviewed by me and considered in my medical decision making (see chart for details).    Elevated blood pressure reading with hypertension, right lower back pain with right-sided sciatica.  Patient is well-appearing and her exam is reassuring.  Instructed her to continue Tylenol as needed for discomfort.  Instructed her to stop the tizanidine and treating with cyclobenzaprine  instead.  Precautions for drowsiness with cyclobenzaprine discussed.  Education provided on sciatica.  Instructed patient to follow-up with her PCP.  Also discussed with patient that her blood pressure is elevated today and needs to be rechecked by PCP in 2 to 4 weeks.  Education provided on managing hypertension.  She agrees to plan of care.   Final Clinical Impressions(s) / UC Diagnoses   Final diagnoses:  Elevated blood pressure reading in office with diagnosis of hypertension  Acute right-sided low back pain with right-sided sciatica     Discharge Instructions      Take the cyclobenzaprine as directed.  Do not drive, operate machinery, drink alcohol, or perform dangerous activities while taking this medication as it may cause drowsiness.  Take Tylenol as needed for discomfort.   Your blood pressure is elevated today at 152/96.  Please have this rechecked by your primary care provider in 2-4 weeks.          ED Prescriptions     Medication Sig Dispense Auth. Provider   cyclobenzaprine (FLEXERIL) 5 MG tablet Take 1 tablet (5 mg total) by mouth 2 (two) times daily as needed for muscle spasms. 20 tablet Mickie Bail, NP      I have reviewed the PDMP during this encounter.   Mickie Bail, NP 02/20/23 1144

## 2023-06-04 ENCOUNTER — Other Ambulatory Visit: Payer: Self-pay

## 2023-06-04 ENCOUNTER — Emergency Department (HOSPITAL_COMMUNITY): Payer: 59

## 2023-06-04 ENCOUNTER — Encounter (HOSPITAL_COMMUNITY): Payer: Self-pay | Admitting: Emergency Medicine

## 2023-06-04 ENCOUNTER — Emergency Department (HOSPITAL_COMMUNITY)
Admission: EM | Admit: 2023-06-04 | Discharge: 2023-06-04 | Disposition: A | Discharge status: Home / Self Care | Payer: 59 | Attending: Emergency Medicine | Admitting: Emergency Medicine

## 2023-06-04 DIAGNOSIS — Y92410 Unspecified street and highway as the place of occurrence of the external cause: Secondary | ICD-10-CM | POA: Insufficient documentation

## 2023-06-04 DIAGNOSIS — M62838 Other muscle spasm: Secondary | ICD-10-CM | POA: Insufficient documentation

## 2023-06-04 DIAGNOSIS — R590 Localized enlarged lymph nodes: Secondary | ICD-10-CM | POA: Diagnosis not present

## 2023-06-04 DIAGNOSIS — R200 Anesthesia of skin: Secondary | ICD-10-CM | POA: Diagnosis present

## 2023-06-04 MED ORDER — METHOCARBAMOL 500 MG PO TABS
1000.0000 mg | ORAL_TABLET | Freq: Once | ORAL | Status: AC
  Administered 2023-06-04: 1000 mg via ORAL
  Filled 2023-06-04: qty 2

## 2023-06-04 MED ORDER — LIDOCAINE 5 % EX PTCH
2.0000 | MEDICATED_PATCH | CUTANEOUS | Status: DC
  Administered 2023-06-04: 2 via TRANSDERMAL
  Filled 2023-06-04: qty 2

## 2023-06-04 MED ORDER — METHOCARBAMOL 500 MG PO TABS: 500.0000 mg | ORAL_TABLET | Freq: Two times a day (BID) | ORAL | 0 refills | Status: AC

## 2023-06-04 MED ORDER — ACETAMINOPHEN 325 MG PO TABS
650.0000 mg | ORAL_TABLET | Freq: Once | ORAL | Status: AC
  Administered 2023-06-04: 650 mg via ORAL
  Filled 2023-06-04: qty 2
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
## 2023-06-04 NOTE — ED Notes (Signed)
Patient left with family prior to obtaining repeat vitals.

## 2023-07-09 ENCOUNTER — Encounter: Payer: Self-pay | Admitting: *Deleted

## 2023-07-09 ENCOUNTER — Ambulatory Visit
Admission: EM | Admit: 2023-07-09 | Discharge: 2023-07-09 | Disposition: A | Discharge status: Home / Self Care | Payer: 59 | Attending: Internal Medicine | Admitting: Internal Medicine

## 2023-07-09 ENCOUNTER — Other Ambulatory Visit: Payer: Self-pay

## 2023-07-09 DIAGNOSIS — L249 Irritant contact dermatitis, unspecified cause: Secondary | ICD-10-CM | POA: Diagnosis not present

## 2023-07-09 MED ORDER — MOMETASONE FUROATE 0.1 % EX OINT: TOPICAL_OINTMENT | Freq: Every day | CUTANEOUS | 0 refills | Status: AC

## 2023-07-09 MED ORDER — HYDROXYZINE HCL 25 MG PO TABS: 12.5000 mg | ORAL_TABLET | Freq: Three times a day (TID) | ORAL | 0 refills | Status: AC | PRN

## 2023-07-09 NOTE — ED Provider Notes (Signed)
[facility]  Note:  This document was prepared using Conservation officer, historic buildings and may include unintentional dictation errors.  MRN: [id number] DOB: [birth year]  Subjective:   Shannon Mayo is a 57 y.o. female presenting for 1 to 2-week history of persistent bites over her lower legs.  She recently had her home treated for fleas.  Currently she is staying with a friend.  They sleep in the same bed, no one else in their home has any kind of rash similar to what she has.  No tenderness.  Has significant pruritus worse at night.  No current facility-administered medications for this encounter.  Current Outpatient Medications:    amitriptyline (ELAVIL) 10 MG tablet, Take by mouth., Disp: , Rfl:    buPROPion (WELLBUTRIN XL) 150 MG 24 hr tablet, Take by mouth., Disp: , Rfl:    dexlansoprazole (DEXILANT) 60 MG capsule, Take 1 capsule by mouth daily., Disp: , Rfl:    hydrochlorothiazide (HYDRODIURIL) 25 MG tablet, Take 25 mg by mouth daily., Disp: , Rfl:    methocarbamol (ROBAXIN) 500 MG tablet, Take 1 tablet (500 mg total) by mouth 2 (two) times daily., Disp: 20 tablet, Rfl: 0   olmesartan (BENICAR) 20 MG tablet, Take 1 tablet by mouth daily., Disp: , Rfl:    rizatriptan (MAXALT) 10 MG tablet, Take by mouth., Disp: , Rfl:    acetaminophen (TYLENOL) 650 MG CR tablet, Take 650 mg by mouth as needed for pain., Disp: , Rfl:    cyclobenzaprine (FLEXERIL) 5 MG tablet, Take 1 tablet (5 mg total) by mouth 2 (two) times daily as needed for muscle spasms., Disp: 20 tablet, Rfl: 0   DULoxetine (CYMBALTA) 20 MG capsule, Take 2 capsules by mouth daily., Disp: , Rfl:    hydrOXYzine (VISTARIL) 25 MG capsule, Take 25 mg by mouth 3 (three) times daily as needed., Disp: , Rfl:    lisinopril (ZESTRIL) 20 MG tablet, Take 1 tablet (20 mg total) by mouth daily., Disp: 30 tablet, Rfl: 0   propranolol (INDERAL) 20 MG tablet, Take by mouth., Disp: , Rfl:    triamcinolone ointment  (KENALOG) 0.1 %, Apply 1 Application topically 2 (two) times daily., Disp: 30 g, Rfl: 0   Allergies  Allergen Reactions   Doxycycline Other (See Comments)    Stomach pain.   Hydromorphone Hcl Other (See Comments)    Stomach pain.   Ibuprofen     REACTION: GI upset   Iohexol      Desc: PATIENT STATES ALLERGY TO IV CONTRAST, SHE INDICATED SEVERE ONSET OF ABDOMINAL PAIN.  I EXPLAINED THIS MAY NOT BE AN ACTUAL ALLERGY, BUT SHE DISAGREED.  NO IV CONTRAST GIVEN TODAY, 09-23-07. LW, Onset Date: [id number]    Morphine Sulfate Other (See Comments)    Stomach pain.   Sulfamethoxazole-Trimethoprim Other (See Comments)    Stomach pain.   Contrast Media [Iodinated Contrast Media] Rash    Past Medical History:  Diagnosis Date   Acid reflux    Arthritis    Depression    Fibromyalgia    Hypertension    IBS (irritable bowel syndrome)    Shingles      Past Surgical History:  Procedure Laterality Date   CHOLECYSTECTOMY     CYSTECTOMY Left 1996   on the neck    TUBAL LIGATION      History reviewed. No pertinent family history.  Social History   Tobacco Use   Smoking status: Never   Smokeless tobacco: Never  Vaping Use   Vaping status: Never Used  Substance Use Topics   Alcohol use: Not Currently   Drug use: No    ROS   Objective:   Vitals: BP (!) 181/106   Pulse 68   Temp 98.2 F (36.8 C) (Oral)   Resp 16   LMP 06/28/2010   SpO2 98%   Physical Exam Constitutional:      General: She is not in acute distress.    Appearance: Normal appearance. She is well-developed. She is not ill-appearing, toxic-appearing or diaphoretic.  HENT:     Head: Normocephalic and atraumatic.     Nose: Nose normal.     Mouth/Throat:     Mouth: Mucous membranes are moist.  Eyes:     General: No scleral icterus.       Right eye: No discharge.        Left eye: No discharge.     Extraocular Movements: Extraocular movements intact.  Cardiovascular:     Rate and Rhythm: Normal rate.   Pulmonary:     Effort: Pulmonary effort is normal.  Skin:    General: Skin is warm and dry.     Findings: Rash (multiple scattered resolving solitary bite wounds without distinct burrows) present.  Neurological:     General: No focal deficit present.     Mental Status: She is alert and oriented to person, place, and time.  Psychiatric:        Mood and Affect: Mood normal.        Behavior: Behavior normal.          Assessment and Plan :   PDMP not reviewed this encounter.  1. Irritant dermatitis    Will manage irritant dermatitis from an unknown offending agent, past with topical steroid, oral hydroxyzine.  Recommended patient hire pest control to evaluate the home where she is staying.  Counseled patient on potential for adverse effects with medications prescribed/recommended today, ER and return-to-clinic precautions discussed, patient verbalized understanding.    Wallis Bamberg, [place] 07/09/23 [phone]

## 2023-07-09 NOTE — ED Triage Notes (Signed)
Pt reports that she started with pruritic lesions to bilat feet and ankles approx 1-2 wks ago; states she felt it might be fleas, so she treated the house. Over past couple days, bite-looking lesions are spreading "everywhere". States no other individuals in the house have the rash. Has been applying old Rx for clobetasol cream.

## 2023-10-09 ENCOUNTER — Other Ambulatory Visit: Payer: Self-pay | Admitting: Family Medicine

## 2023-10-09 DIAGNOSIS — M792 Neuralgia and neuritis, unspecified: Secondary | ICD-10-CM

## 2023-10-09 DIAGNOSIS — M5030 Other cervical disc degeneration, unspecified cervical region: Secondary | ICD-10-CM

## 2023-10-09 DIAGNOSIS — R29898 Other symptoms and signs involving the musculoskeletal system: Secondary | ICD-10-CM

## 2023-10-18 ENCOUNTER — Ambulatory Visit (HOSPITAL_COMMUNITY)
Admission: RE | Admit: 2023-10-18 | Discharge: 2023-10-18 | Disposition: A | Discharge status: Home / Self Care | Payer: 59 | Source: Ambulatory Visit | Attending: Family Medicine | Admitting: Family Medicine

## 2023-10-18 DIAGNOSIS — M5030 Other cervical disc degeneration, unspecified cervical region: Secondary | ICD-10-CM | POA: Diagnosis present

## 2023-10-18 DIAGNOSIS — M792 Neuralgia and neuritis, unspecified: Secondary | ICD-10-CM | POA: Insufficient documentation

## 2023-10-18 DIAGNOSIS — R29898 Other symptoms and signs involving the musculoskeletal system: Secondary | ICD-10-CM | POA: Insufficient documentation

## 2023-12-07 ENCOUNTER — Emergency Department (HOSPITAL_COMMUNITY): Payer: 59

## 2023-12-07 ENCOUNTER — Emergency Department (HOSPITAL_COMMUNITY)
Admission: EM | Admit: 2023-12-07 | Discharge: 2023-12-07 | Disposition: A | Discharge status: Home / Self Care | Payer: 59 | Attending: Emergency Medicine | Admitting: Emergency Medicine

## 2023-12-07 ENCOUNTER — Encounter (HOSPITAL_COMMUNITY): Payer: Self-pay | Admitting: Emergency Medicine

## 2023-12-07 DIAGNOSIS — Z79899 Other long term (current) drug therapy: Secondary | ICD-10-CM | POA: Diagnosis not present

## 2023-12-07 DIAGNOSIS — R110 Nausea: Secondary | ICD-10-CM | POA: Diagnosis not present

## 2023-12-07 DIAGNOSIS — R519 Headache, unspecified: Secondary | ICD-10-CM | POA: Diagnosis present

## 2023-12-07 DIAGNOSIS — I10 Essential (primary) hypertension: Secondary | ICD-10-CM | POA: Insufficient documentation

## 2023-12-07 LAB — URINALYSIS, ROUTINE W REFLEX MICROSCOPIC
Bilirubin Urine: NEGATIVE
Glucose, UA: NEGATIVE mg/dL
Hgb urine dipstick: NEGATIVE
Ketones, ur: NEGATIVE mg/dL
Leukocytes,Ua: NEGATIVE
Nitrite: NEGATIVE
Protein, ur: NEGATIVE mg/dL
Specific Gravity, Urine: 1.014 (ref 1.005–1.030)
pH: 8 (ref 5.0–8.0)

## 2023-12-07 LAB — CBC WITH DIFFERENTIAL/PLATELET
Abs Immature Granulocytes: 0.01 10*3/uL (ref 0.00–0.07)
Basophils Absolute: 0 10*3/uL (ref 0.0–0.1)
Basophils Relative: 0 %
Eosinophils Absolute: 0.3 10*3/uL (ref 0.0–0.5)
Eosinophils Relative: 5 %
HCT: 44.1 % (ref 36.0–46.0)
Hemoglobin: 13.9 g/dL (ref 12.0–15.0)
Immature Granulocytes: 0 %
Lymphocytes Relative: 18 %
Lymphs Abs: 1.1 10*3/uL (ref 0.7–4.0)
MCH: 31.2 pg (ref 26.0–34.0)
MCHC: 31.5 g/dL (ref 30.0–36.0)
MCV: 99.1 fL (ref 80.0–100.0)
Monocytes Absolute: 0.5 10*3/uL (ref 0.1–1.0)
Monocytes Relative: 8 %
Neutro Abs: 4.3 10*3/uL (ref 1.7–7.7)
Neutrophils Relative %: 69 %
Platelets: 236 10*3/uL (ref 150–400)
RBC: 4.45 MIL/uL (ref 3.87–5.11)
RDW: 13 % (ref 11.5–15.5)
WBC: 6.2 10*3/uL (ref 4.0–10.5)
nRBC: 0 % (ref 0.0–0.2)

## 2023-12-07 LAB — COMPREHENSIVE METABOLIC PANEL
ALT: 28 U/L (ref 0–44)
AST: 24 U/L (ref 15–41)
Albumin: 4.1 g/dL (ref 3.5–5.0)
Alkaline Phosphatase: 82 U/L (ref 38–126)
Anion gap: 11 (ref 5–15)
BUN: 11 mg/dL (ref 6–20)
CO2: 28 mmol/L (ref 22–32)
Calcium: 9.5 mg/dL (ref 8.9–10.3)
Chloride: 100 mmol/L (ref 98–111)
Creatinine, Ser: 0.86 mg/dL (ref 0.44–1.00)
GFR, Estimated: >60 mL/min (ref >60)
Glucose, Bld: 111 mg/dL — ABNORMAL HIGH (ref 70–99)
Potassium: 4.1 mmol/L (ref 3.5–5.1)
Sodium: 139 mmol/L (ref 135–145)
Total Bilirubin: 0.5 mg/dL (ref 0.0–1.2)
Total Protein: 7.8 g/dL (ref 6.5–8.1)

## 2023-12-07 LAB — TROPONIN I (HIGH SENSITIVITY): Troponin I (High Sensitivity): 3 ng/L (ref <18)

## 2023-12-07 LAB — PREGNANCY, URINE: Preg Test, Ur: NEGATIVE

## 2023-12-07 MED ORDER — PROCHLORPERAZINE EDISYLATE 10 MG/2ML IJ SOLN
10.0000 mg | Freq: Once | INTRAMUSCULAR | Status: AC
  Administered 2023-12-07: 10 mg via INTRAVENOUS
  Filled 2023-12-07: qty 2

## 2023-12-07 MED ORDER — HYDRALAZINE HCL 20 MG/ML IJ SOLN: 5.0000 mg | Freq: Once | INTRAMUSCULAR | Status: DC

## 2023-12-07 MED ORDER — HYDRALAZINE HCL 20 MG/ML IJ SOLN
2.0000 mg | Freq: Once | INTRAMUSCULAR | Status: AC
  Administered 2023-12-07: 2 mg via INTRAVENOUS
  Filled 2023-12-07: qty 1

## 2023-12-07 MED ORDER — HYDROCHLOROTHIAZIDE 25 MG PO TABS
25.0000 mg | ORAL_TABLET | Freq: Once | ORAL | Status: DC
  Filled 2023-12-07: qty 1

## 2023-12-07 MED ORDER — DIPHENHYDRAMINE HCL 50 MG/ML IJ SOLN
25.0000 mg | Freq: Once | INTRAMUSCULAR | Status: AC
  Administered 2023-12-07: 25 mg via INTRAVENOUS
  Filled 2023-12-07: qty 1

## 2023-12-07 MED ORDER — LACTATED RINGERS IV BOLUS
500.0000 mL | Freq: Once | INTRAVENOUS | Status: AC
  Administered 2023-12-07: 500 mL via INTRAVENOUS

## 2023-12-07 NOTE — ED Provider Triage Note (Signed)
 Emergency Medicine Provider Triage Evaluation Note  Shannon Mayo , a 58 y.o. female  was evaluated in triage.  Pt complains of HA, HTN.  Review of Systems  Positive: N/V, HA, photophobia,  Negative: CP, SHOB, neck stiffness, fever, blurred vision, dizziness, tinnitus  Physical Exam  LMP 06/28/2010  Gen:   Awake, no distress   Resp:  Normal effort  MSK:   Moves extremities without difficulty  Other:    Medical Decision Making  Medically screening exam initiated at 2:17 PM.  Appropriate orders placed.  Shannon Mayo was informed that the remainder of the evaluation will be completed by another provider, this initial triage assessment does not replace that evaluation, and the importance of remaining in the ED until their evaluation is complete.  Labs and imaging ordered   Gretta Began 12/07/23 1419

## 2023-12-07 NOTE — ED Triage Notes (Signed)
 Pt states that when she woke up this morning she had a frontal headache and was nauseas. Pt endorses photophobia as well. Denies unilateral weakness, no vision changes. Pt has hx of HTN and did not take her medication this morning.

## 2023-12-07 NOTE — ED Provider Notes (Signed)
 [place] [facility] AT [facility] Provider Note   CSN: [id number] Arrival date & time: 12/07/23  1403     History {Add pertinent medical, surgical, social history, OB history to HPI:1} Chief Complaint  Patient presents with   Headache    Shannon Mayo is a 58 y.o. female.  HPI     58 year old female with a history of hypertension, depression, fibromyalgia Past Medical History:  Diagnosis Date   Acid reflux    Arthritis    Depression    Fibromyalgia    Hypertension    IBS (irritable bowel syndrome)    Shingles      Home Medications Prior to Admission medications   Medication Sig Start Date End Date Taking? Authorizing Provider  acetaminophen (TYLENOL) 650 MG CR tablet Take 650 mg by mouth as needed for pain.    [provider]  amitriptyline (ELAVIL) 10 MG tablet Take by mouth. 09/16/22   [provider]  buPROPion (WELLBUTRIN XL) 150 MG 24 hr tablet Take by mouth. 09/16/22   [provider]  cyclobenzaprine (FLEXERIL) 5 MG tablet Take 1 tablet (5 mg total) by mouth 2 (two) times daily as needed for muscle spasms. 02/20/23   Mickie Bail, NP  dexlansoprazole (DEXILANT) 60 MG capsule Take 1 capsule by mouth daily.    [provider]  DULoxetine (CYMBALTA) 20 MG capsule Take 2 capsules by mouth daily. 09/16/22   [provider]  hydrochlorothiazide (HYDRODIURIL) 25 MG tablet Take 25 mg by mouth daily.    [provider]  hydrOXYzine (ATARAX) 25 MG tablet Take 0.5-1 tablets (12.5-25 mg total) by mouth every 8 (eight) hours as needed for itching. 07/09/23   Wallis Bamberg, PA-C  lisinopril (ZESTRIL) 20 MG tablet Take 1 tablet (20 mg total) by mouth daily. 08/20/22   Rodriguez-Southworth, Nettie Elm, PA-C  methocarbamol (ROBAXIN) 500 MG tablet Take 1 tablet (500 mg total) by mouth 2 (two) times daily. 06/04/23   Sponseller, Lupe Carney R, PA-C  mometasone (ELOCON) 0.1 % ointment Apply topically daily.  07/09/23   Wallis Bamberg, PA-C  olmesartan (BENICAR) 20 MG tablet Take 1 tablet by mouth daily. 02/08/23   [provider]  propranolol (INDERAL) 20 MG tablet Take by mouth. 12/14/22   [provider]  rizatriptan (MAXALT) 10 MG tablet Take by mouth. 12/14/22   [provider]  triamcinolone ointment (KENALOG) 0.1 % Apply 1 Application topically 2 (two) times daily. 02/10/23   Katha Cabal, DO      Allergies    Doxycycline, Hydromorphone hcl, Ibuprofen, Iohexol, Morphine sulfate, Sulfamethoxazole-trimethoprim, and Contrast media [iodinated contrast media]    Review of Systems   Review of Systems  Physical Exam Updated Vital Signs BP (!) 194/104 (BP Location: Left Arm)   Pulse 65   Temp 98.5 F (36.9 C) (Oral)   Resp 16   LMP 06/28/2010   SpO2 100%  Physical Exam  ED Results / Procedures / Treatments   Labs (all labs ordered are listed, but only abnormal results are displayed) Labs Reviewed  COMPREHENSIVE METABOLIC PANEL - Abnormal; Notable for the following components:      Result Value   Glucose, Bld 111 (*)    All other components within normal limits  URINALYSIS, ROUTINE W REFLEX MICROSCOPIC - Abnormal; Notable for the following components:   APPearance CLOUDY (*)    All other components within normal limits  CBC WITH DIFFERENTIAL/PLATELET  PREGNANCY, URINE  TROPONIN I (HIGH SENSITIVITY)  TROPONIN I (HIGH  SENSITIVITY)    EKG None  Radiology CT Head Wo Contrast Result Date: 12/07/2023 CLINICAL DATA:  Frontal headache and nausea, photophobia. EXAM: CT HEAD WITHOUT CONTRAST TECHNIQUE: Contiguous axial images were obtained from the base of the skull through the vertex without intravenous contrast. RADIATION DOSE REDUCTION: This exam was performed according to the departmental dose-optimization program which includes automated exposure control, adjustment of the mA and/or kV according to patient size and/or use of iterative reconstruction technique.  COMPARISON:  MRI head and CT head 08/31/2020 FINDINGS: Brain: No acute intracranial hemorrhage. No CT evidence of acute infarct. No edema, mass effect, or midline shift. The basilar cisterns are patent. Ventricles: The ventricles are normal. Vascular: No hyperdense vessel or unexpected calcification. Skull: No acute or aggressive finding. Orbits: Orbits are symmetric. Sinuses: There is likely a large mucous retention cyst in the partially visualized right maxillary sinus. Mucosal thickening in the ethmoid sinuses. Other: Mastoid air cells are clear. IMPRESSION: 1. No CT evidence of acute intracranial pathology. Electronically Signed   By: Emily Filbert M.D.   On: 12/07/2023 15:47    Procedures Procedures  {Document cardiac monitor, telemetry assessment procedure when appropriate:1}  Medications Ordered in ED Medications - No data to display  ED Course/ Medical Decision Making/ A&P   {   Click here for ABCD2, HEART and other calculatorsREFRESH Note before signing :1}                              Medical Decision Making  ***   Labs completed personally by interpreted by me show normal creatinine, no clinically significant electrolyte abnormalities, no anemia or leukocytosis, normal troponin and doubt ACS, urinalysis without signs of urinary tract infection, negative pregnancy test.  CT head was completed given hypertension and headache and was personally eval and interpreted by me and showed no evidence of intracranial hemorrhage, mucous retention cyst present right maxillary sinus. {Document critical care time when appropriate:1} {Document review of labs and clinical decision tools ie heart score, Chads2Vasc2 etc:1}  {Document your independent review of radiology images, and any outside records:1} {Document your discussion with family members, caretakers, and with consultants:1} {Document social determinants of health affecting pt's care:1} {Document your decision making why or why not  admission, treatments were needed:1} Final Clinical Impression(s) / ED Diagnoses Final diagnoses:  None    Rx / DC Orders ED Discharge Orders     None
